# Patient Record
Sex: Male | Born: 1990 | Race: White | Hispanic: No | Marital: Single | Smoking: Current every day smoker
Health system: Southern US, Community
[De-identification: ages and names within clinical notes are randomized; demographics above are authoritative.]

## PROBLEM LIST (undated history)

## (undated) DIAGNOSIS — F909 Attention-deficit hyperactivity disorder, unspecified type: Secondary | ICD-10-CM

## (undated) DIAGNOSIS — I10 Essential (primary) hypertension: Secondary | ICD-10-CM

---
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
## 2009-10-24 ENCOUNTER — Emergency Department (HOSPITAL_COMMUNITY): Admission: EM | Admit: 2009-10-24 | Discharge: 2009-10-24 | Payer: Self-pay | Admitting: Emergency Medicine

## 2010-09-24 ENCOUNTER — Emergency Department (HOSPITAL_COMMUNITY)
Admission: EM | Admit: 2010-09-24 | Discharge: 2010-09-24 | Payer: Self-pay | Source: Home / Self Care | Admitting: Emergency Medicine

## 2010-12-02 ENCOUNTER — Emergency Department: Payer: Self-pay | Admitting: Emergency Medicine

## 2011-05-11 ENCOUNTER — Emergency Department (HOSPITAL_COMMUNITY)
Admission: EM | Admit: 2011-05-11 | Discharge: 2011-05-11 | Disposition: A | Discharge status: Home / Self Care | Payer: Self-pay | Attending: Emergency Medicine | Admitting: Emergency Medicine

## 2011-05-11 DIAGNOSIS — J45909 Unspecified asthma, uncomplicated: Secondary | ICD-10-CM | POA: Insufficient documentation

## 2011-05-11 DIAGNOSIS — K089 Disorder of teeth and supporting structures, unspecified: Secondary | ICD-10-CM | POA: Insufficient documentation

## 2011-05-11 DIAGNOSIS — S025XXA Fracture of tooth (traumatic), initial encounter for closed fracture: Secondary | ICD-10-CM | POA: Insufficient documentation

## 2011-05-11 DIAGNOSIS — X58XXXA Exposure to other specified factors, initial encounter: Secondary | ICD-10-CM | POA: Insufficient documentation

## 2011-05-11 DIAGNOSIS — K029 Dental caries, unspecified: Secondary | ICD-10-CM | POA: Insufficient documentation

## 2011-08-28 ENCOUNTER — Emergency Department (HOSPITAL_COMMUNITY)
Admission: EM | Admit: 2011-08-28 | Discharge: 2011-08-29 | Disposition: A | Discharge status: Home / Self Care | Payer: Self-pay | Attending: Emergency Medicine | Admitting: Emergency Medicine

## 2011-08-28 ENCOUNTER — Encounter: Payer: Self-pay | Admitting: Emergency Medicine

## 2011-08-28 DIAGNOSIS — I880 Nonspecific mesenteric lymphadenitis: Secondary | ICD-10-CM | POA: Insufficient documentation

## 2011-08-28 DIAGNOSIS — J45909 Unspecified asthma, uncomplicated: Secondary | ICD-10-CM | POA: Insufficient documentation

## 2011-08-28 DIAGNOSIS — R109 Unspecified abdominal pain: Secondary | ICD-10-CM | POA: Insufficient documentation

## 2011-08-28 DIAGNOSIS — M549 Dorsalgia, unspecified: Secondary | ICD-10-CM | POA: Insufficient documentation

## 2011-08-28 DIAGNOSIS — R1013 Epigastric pain: Secondary | ICD-10-CM | POA: Insufficient documentation

## 2011-08-28 LAB — CBC
HCT: 44.2 % (ref 39.0–52.0)
MCH: 31.4 pg (ref 26.0–34.0)
MCV: 87.9 fL (ref 78.0–100.0)
Platelets: 241 10*3/uL (ref 150–400)
RBC: 5.03 MIL/uL (ref 4.22–5.81)
RDW: 11.9 % (ref 11.5–15.5)
WBC: 5.1 10*3/uL (ref 4.0–10.5)

## 2011-08-28 LAB — DIFFERENTIAL
Eosinophils Absolute: 0.1 10*3/uL (ref 0.0–0.7)
Eosinophils Relative: 3 % (ref 0–5)
Lymphocytes Relative: 35 % (ref 12–46)
Lymphs Abs: 1.8 10*3/uL (ref 0.7–4.0)
Monocytes Absolute: 0.4 10*3/uL (ref 0.1–1.0)

## 2011-08-28 LAB — COMPREHENSIVE METABOLIC PANEL
AST: 22 U/L (ref 0–37)
Albumin: 4.4 g/dL (ref 3.5–5.2)
Alkaline Phosphatase: 71 U/L (ref 39–117)
BUN: 9 mg/dL (ref 6–23)
Chloride: 103 mEq/L (ref 96–112)
Potassium: 3.7 mEq/L (ref 3.5–5.1)
Sodium: 140 mEq/L (ref 135–145)
Total Bilirubin: 0.2 mg/dL — ABNORMAL LOW (ref 0.3–1.2)
Total Protein: 7.4 g/dL (ref 6.0–8.3)

## 2011-08-28 LAB — URINALYSIS, ROUTINE W REFLEX MICROSCOPIC
Bilirubin Urine: NEGATIVE
Glucose, UA: NEGATIVE mg/dL
Ketones, ur: NEGATIVE mg/dL
Protein, ur: NEGATIVE mg/dL
pH: 7 (ref 5.0–8.0)

## 2011-08-28 MED ORDER — SODIUM CHLORIDE 0.9 % IV SOLN
Freq: Once | INTRAVENOUS | Status: AC
  Administered 2011-08-28: 23:00:00 via INTRAVENOUS

## 2011-08-28 MED ORDER — MORPHINE SULFATE 4 MG/ML IJ SOLN
4.0000 mg | Freq: Once | INTRAMUSCULAR | Status: AC
  Administered 2011-08-28: 4 mg via INTRAVENOUS
  Filled 2011-08-28: qty 1

## 2011-08-28 MED ORDER — ONDANSETRON HCL 4 MG/2ML IJ SOLN
4.0000 mg | Freq: Once | INTRAMUSCULAR | Status: AC
  Administered 2011-08-28: 4 mg via INTRAVENOUS
  Filled 2011-08-28: qty 2

## 2011-08-28 NOTE — ED Provider Notes (Signed)
History     CSN: [id number] Arrival date & time: 08/28/2011  8:31 PM   First MD Initiated Contact with Patient 08/28/11 2217      Chief Complaint  Patient presents with  . Abdominal Pain    (Consider location/radiation/quality/duration/timing/severity/associated sxs/prior treatment) HPI Comments: Shawn Bolton reports 2 days of burning pain in the epigastrium and lower abdomen.  Denies nausea, vomiting, diarrhea.  Pain radiates to his back.  Does not have any dysuria, fever, has never had this pain before.  He, feels more comfortable curled up in a ball when he stands up straight.  Pain increases.  He has taken no over-the-counter medicines except milk of magnesia, although he didn't know what the medication was for reports.  Normal bowel movements.   Patient is a 20 y.o. male presenting with abdominal pain.  Abdominal Pain The primary symptoms of the illness include abdominal pain. The primary symptoms of the illness do not include fever, shortness of breath, nausea, vomiting, diarrhea or dysuria. The current episode started yesterday. The onset of the illness was gradual. The problem has been gradually worsening.  Additional symptoms associated with the illness include back pain. Symptoms associated with the illness do not include chills or constipation.    Past Medical History  Diagnosis Date  . Asthma     History reviewed. No pertinent past surgical history.  No family history on file.  History  Substance Use Topics  . Smoking status: Current Everyday Smoker  . Smokeless tobacco: Not on file  . Alcohol Use: No      Review of Systems  Constitutional: Negative for fever and chills.  HENT: Negative.   Respiratory: Negative for shortness of breath.   Cardiovascular: Negative.   Gastrointestinal: Positive for abdominal pain. Negative for nausea, vomiting, diarrhea, constipation and abdominal distention.  Genitourinary: Negative for dysuria.  Musculoskeletal: Positive for  back pain.  Skin: Negative.   Neurological: Negative.   Hematological: Negative.   Psychiatric/Behavioral: Negative.     Allergies  Review of patient's allergies indicates no known allergies.  Home Medications   Current Outpatient Rx  Name Route Sig Dispense Refill  . MAGNESIUM HYDROXIDE 400 MG/5ML PO SUSP Oral Take 30 mLs by mouth daily as needed. For constipation     . HYDROCODONE-ACETAMINOPHEN 5-325 MG PO TABS Oral Take 1 tablet by mouth every 6 (six) hours as needed for pain. 10 tablet 0    BP 130/67  Pulse 57  Temp(Src) 97.9 F (36.6 C) (Oral)  Resp 16  SpO2 98%  Physical Exam  ED Course  Procedures (including critical care time)  Labs Reviewed  COMPREHENSIVE METABOLIC PANEL - Abnormal; Notable for the following:    Total Bilirubin 0.2 (*)    All other components within normal limits  URINALYSIS, ROUTINE W REFLEX MICROSCOPIC - Abnormal; Notable for the following:    APPearance TURBID (*)    All other components within normal limits  CBC  DIFFERENTIAL  URINE MICROSCOPIC-ADD ON   Ct Abdomen Pelvis W Contrast  08/29/2011  *RADIOLOGY REPORT*  Clinical Data: Right lower quadrant abdominal pain and nausea.  CT ABDOMEN AND PELVIS WITH CONTRAST  Technique:  Multidetector CT imaging of the abdomen and pelvis was performed following the standard protocol during bolus administration of intravenous contrast.  Contrast: OMNIPAQUE IOHEXOL 300 MG/ML IV SOLN  Comparison: None.  Findings: The visualized lung bases are clear.  There is diffuse fatty infiltration within the liver, with mild sparing adjacent to the gallbladder.  The gallbladder  is unremarkable in appearance.  The spleen is normal in appearance. The pancreas and adrenal glands are unremarkable.  The kidneys are unremarkable in appearance.  There is no evidence of hydronephrosis.  No renal or ureteral stones are seen.  No perinephric stranding is appreciated.  No free fluid is identified.  The small bowel is  unremarkable in appearance.  The stomach is within normal limits.  No acute vascular abnormalities are seen.  A few mildly prominent pericecal nodes are seen.  The appendix is normal in caliber, without evidence for appendicitis.  The colon is unremarkable in appearance.  The bladder is mildly distended and grossly unremarkable.  The prostate remains normal in size.  No inguinal lymphadenopathy is seen.  No acute osseous abnormalities are identified.  IMPRESSION:  1.  No evidence for appendicitis. 2.  Few mildly prominent pericecal nodes could conceivably reflect minimal mesenteric adenitis. 3.  Diffuse fatty infiltration within the liver.  Original Report Authenticated By: Tonia Ghent, M.D.     1. Acute mesenteric adenitis     At 1:48 AM patient, reports he's gotten no relief from IV morphine for his pain still reporting a 5/10.  We'll switch to 0.5 mg Dilaudid and reevaluate  MDM  After reviewing labs will evaluate for acute appendicitis with CT scan        Arman Filter, NP 08/29/11 0149  Arman Filter, NP 08/29/11 0423  Arman Filter, NP 08/29/11 [phone]

## 2011-08-28 NOTE — ED Notes (Signed)
PT. REPORTS MID ABDOMINAL PAIN FOR 2 DAYS WITH SLIGHT NAUSEA , DENIES VOMITTING OR DIARRHEA.  NO FEVER OR CHILLS.

## 2011-08-29 ENCOUNTER — Emergency Department (HOSPITAL_COMMUNITY): Payer: Self-pay

## 2011-08-29 MED ORDER — HYDROMORPHONE HCL PF 1 MG/ML IJ SOLN
INTRAMUSCULAR | Status: AC
  Filled 2011-08-29: qty 1

## 2011-08-29 MED ORDER — HYDROMORPHONE HCL PF 1 MG/ML IJ SOLN
0.5000 mg | Freq: Once | INTRAMUSCULAR | Status: AC
  Administered 2011-08-29: 0.5 mg via INTRAVENOUS

## 2011-08-29 MED ORDER — HYDROCODONE-ACETAMINOPHEN 5-325 MG PO TABS: 1.0000 | ORAL_TABLET | Freq: Four times a day (QID) | ORAL | Status: AC | PRN

## 2011-08-29 MED ORDER — IOHEXOL 300 MG/ML  SOLN
100.0000 mL | Freq: Once | INTRAMUSCULAR | Status: AC | PRN
  Administered 2011-08-29: 100 mL via INTRAVENOUS

## 2011-08-29 MED ORDER — MORPHINE SULFATE 4 MG/ML IJ SOLN
4.0000 mg | INTRAMUSCULAR | Status: DC | PRN
  Administered 2011-08-29: 4 mg via INTRAVENOUS
  Filled 2011-08-29: qty 1

## 2011-08-29 MED ORDER — HYDROMORPHONE HCL PF 1 MG/ML IJ SOLN
0.5000 mg | Freq: Once | INTRAMUSCULAR | Status: AC
  Administered 2011-08-29: 0.5 mg via INTRAVENOUS
  Filled 2011-08-29: qty 1

## 2011-08-29 NOTE — ED Notes (Signed)
Pt voiced understanding to f/u with PCP and take rx as prescribed.

## 2011-08-30 NOTE — ED Provider Notes (Signed)
Medical screening examination/treatment/procedure(s) were performed by non-physician practitioner and as supervising physician I was immediately available for consultation/collaboration.   Benny Lennert, MD 08/30/11 [phone]

## 2012-07-26 ENCOUNTER — Encounter (HOSPITAL_COMMUNITY): Payer: Self-pay | Admitting: *Deleted

## 2012-07-26 ENCOUNTER — Emergency Department (HOSPITAL_COMMUNITY)
Admission: EM | Admit: 2012-07-26 | Discharge: 2012-07-26 | Disposition: A | Discharge status: Home / Self Care | Payer: Self-pay | Attending: Emergency Medicine | Admitting: Emergency Medicine

## 2012-07-26 DIAGNOSIS — J45909 Unspecified asthma, uncomplicated: Secondary | ICD-10-CM | POA: Insufficient documentation

## 2012-07-26 DIAGNOSIS — F17200 Nicotine dependence, unspecified, uncomplicated: Secondary | ICD-10-CM | POA: Insufficient documentation

## 2012-07-26 DIAGNOSIS — M436 Torticollis: Secondary | ICD-10-CM | POA: Insufficient documentation

## 2012-07-26 MED ORDER — IBUPROFEN 800 MG PO TABS: 800.0000 mg | ORAL_TABLET | Freq: Three times a day (TID) | ORAL | Status: DC

## 2012-07-26 MED ORDER — CYCLOBENZAPRINE HCL 10 MG PO TABS: 10.0000 mg | ORAL_TABLET | Freq: Two times a day (BID) | ORAL | Status: DC | PRN

## 2012-07-26 MED ORDER — OXYCODONE-ACETAMINOPHEN 5-325 MG PO TABS: 1.0000 | ORAL_TABLET | Freq: Four times a day (QID) | ORAL | Status: DC | PRN

## 2012-07-26 NOTE — ED Provider Notes (Signed)
History     CSN: [id number]  Arrival date & time 07/26/12  0740   First MD Initiated Contact with Patient 07/26/12 [phone]      Chief Complaint  Patient presents with  . Neck Pain    (Consider location/radiation/quality/duration/timing/severity/associated sxs/prior treatment) HPI Comments: Shawn Bolton present ambulatory for evaluation of right-sided neck pain.  He denies fever or trauma but states that his neck has been stiff for 2 weeks.  He is unsure if it is associated with the heavy lifting he performs at work.  He states the pain is worsened by rotation of the neck or flexion.  He denies any other complaints.  Patient is a 21 y.o. male presenting with neck pain. The history is provided by the patient. No language interpreter was used.  Neck Pain  This is a new problem. The current episode started more than 1 week ago (2 weeks). The problem has been gradually worsening. The pain is associated with nothing. There has been no fever. The pain is present in the right side. The quality of the pain is described as cramping. The pain does not radiate. The pain is at a severity of 8/10. The pain is severe. The symptoms are aggravated by twisting (flexion of neck and turning to the right). The pain is the same all the time. Stiffness is present in the morning. Pertinent negatives include no photophobia, no visual change, no chest pain, no numbness, no headaches, no leg pain, no paresis, no tingling and no weakness. He has tried NSAIDs for the symptoms. The treatment provided mild relief.    Past Medical History  Diagnosis Date  . Asthma     History reviewed. No pertinent past surgical history.  No family history on file.  History  Substance Use Topics  . Smoking status: Current Every Day Smoker  . Smokeless tobacco: Not on file  . Alcohol Use: No      Review of Systems  HENT: Positive for neck pain.   Eyes: Negative for photophobia.  Cardiovascular: Negative for chest pain.    Neurological: Negative for tingling, weakness, numbness and headaches.  All other systems reviewed and are negative.    Allergies  Review of patient's allergies indicates no known allergies.  Home Medications   Current Outpatient Rx  Name  Route  Sig  Dispense  Refill  . IBUPROFEN 200 MG PO TABS   Oral   Take 400 mg by mouth every 6 (six) hours as needed. For neck pain           BP 148/82  Pulse 71  Temp 97.8 F (36.6 C) (Oral)  Resp 16  SpO2 98%  Physical Exam  Nursing note and vitals reviewed. Constitutional: He is oriented to person, place, and time. He appears well-developed and well-nourished. He is active.  Non-toxic appearance. He does not have a sickly appearance. He does not appear ill. No distress.  HENT:  Head: Normocephalic and atraumatic.  Right Ear: External ear normal.  Left Ear: External ear normal.  Nose: Nose normal.  Mouth/Throat: Oropharynx is clear and moist. No oropharyngeal exudate.  Eyes: Conjunctivae normal are normal. Pupils are equal, round, and reactive to light. Right eye exhibits no discharge. Left eye exhibits no discharge. No scleral icterus.  Neck: Trachea normal and phonation normal. Neck supple. Normal carotid pulses and no JVD present. Muscular tenderness present. No tracheal tenderness and no spinous process tenderness present. Carotid bruit is not present. No rigidity. Decreased range of motion present. No  tracheal deviation, no edema and no erythema present.    Cardiovascular: Normal rate, regular rhythm, normal heart sounds and intact distal pulses.  Exam reveals no gallop and no friction rub.   No murmur heard. Pulmonary/Chest: Effort normal and breath sounds normal. No stridor. No respiratory distress. He has no wheezes. He has no rales. He exhibits no tenderness.  Abdominal: Soft. Bowel sounds are normal. He exhibits no distension. There is no tenderness. There is no guarding.  Musculoskeletal: He exhibits tenderness (see neck  exam). He exhibits no edema.  Lymphadenopathy:    He has no cervical adenopathy.  Neurological: He is alert and oriented to person, place, and time. No cranial nerve deficit.  Skin: Skin is warm and dry. No rash noted. He is not diaphoretic. No erythema. No pallor.  Psychiatric: He has a normal mood and affect. His behavior is normal.    ED Course  Procedures (including critical care time)  Labs Reviewed - No data to display No results found.   No diagnosis found.    MDM  Pt presents for evaluation of neck pain.  He appears nontoxic and has no clinical evidence of meningitis.  His exam is consistent with cervical muscle spasm/torticollis.  Will treat with rest, ibuprofen, flexeril, and prn percocet (the percocet will be limited to a 48 hour course).  Plan discharge home.        Tobin Chad, MD 07/26/12 [phone]

## 2012-07-26 NOTE — ED Notes (Signed)
Pt reports stiff neck/pain x 2 weeks. No known injury, however states does a lot of heavy lifting at work. Taken OTC ibuprofen without relief. Denies fever/chills/headache.

## 2013-02-05 ENCOUNTER — Encounter (HOSPITAL_COMMUNITY): Payer: Self-pay | Admitting: *Deleted

## 2013-02-05 ENCOUNTER — Emergency Department (HOSPITAL_COMMUNITY)
Admission: EM | Admit: 2013-02-05 | Discharge: 2013-02-05 | Disposition: A | Discharge status: Home / Self Care | Payer: Self-pay | Attending: Emergency Medicine | Admitting: Emergency Medicine

## 2013-02-05 DIAGNOSIS — Y939 Activity, unspecified: Secondary | ICD-10-CM | POA: Insufficient documentation

## 2013-02-05 DIAGNOSIS — F17200 Nicotine dependence, unspecified, uncomplicated: Secondary | ICD-10-CM | POA: Insufficient documentation

## 2013-02-05 DIAGNOSIS — S1096XA Insect bite of unspecified part of neck, initial encounter: Secondary | ICD-10-CM | POA: Insufficient documentation

## 2013-02-05 DIAGNOSIS — W57XXXA Bitten or stung by nonvenomous insect and other nonvenomous arthropods, initial encounter: Secondary | ICD-10-CM | POA: Insufficient documentation

## 2013-02-05 DIAGNOSIS — Y929 Unspecified place or not applicable: Secondary | ICD-10-CM | POA: Insufficient documentation

## 2013-02-05 DIAGNOSIS — J45909 Unspecified asthma, uncomplicated: Secondary | ICD-10-CM | POA: Insufficient documentation

## 2013-02-05 MED ORDER — DOXYCYCLINE HYCLATE 100 MG PO TABS
100.0000 mg | ORAL_TABLET | Freq: Once | ORAL | Status: AC
  Administered 2013-02-05: 100 mg via ORAL
  Filled 2013-02-05: qty 1

## 2013-02-05 MED ORDER — ACETAMINOPHEN 325 MG PO TABS
650.0000 mg | ORAL_TABLET | Freq: Once | ORAL | Status: AC
  Administered 2013-02-05: 650 mg via ORAL
  Filled 2013-02-05: qty 2

## 2013-02-05 MED ORDER — DOXYCYCLINE HYCLATE 100 MG PO CAPS: 100.0000 mg | ORAL_CAPSULE | Freq: Two times a day (BID) | ORAL | Status: DC

## 2013-02-05 MED ORDER — ONDANSETRON 4 MG PO TBDP
4.0000 mg | ORAL_TABLET | Freq: Once | ORAL | Status: AC
  Administered 2013-02-05: 4 mg via ORAL
  Filled 2013-02-05: qty 1

## 2013-02-05 MED ORDER — PROMETHAZINE HCL 25 MG PO TABS
25.0000 mg | ORAL_TABLET | Freq: Four times a day (QID) | ORAL | Status: DC | PRN
Start: 2013-02-05 — End: 2013-09-25

## 2013-02-05 NOTE — ED Notes (Signed)
Pt states he was bit by a tick yesterday and has not felt well since he woke up this AM. Red area noted to upper thigh less than 1 cm in diameter, surrounding skin intact. No drainage noted to bite. Pt unsure of what type of tic it was. Pt has had fevers at home that he took ibuprophen for.

## 2013-02-05 NOTE — ED Provider Notes (Signed)
History     CSN: [id number]  Arrival date & time 02/05/13  6578   First MD Initiated Contact with Patient 02/05/13 1821      Chief Complaint  Patient presents with  . Headache  . Emesis    (Consider location/radiation/quality/duration/timing/severity/associated sxs/prior treatment) HPI  She presents the emergency department for evaluation after obtaining a tick bite yesterday. Said the tick was not engorged and was tiny. He took procedures and was able to remove the whole take including head. Today he has a mild headache which relieved with ibuprofen and also some nausea. He said he had one episode of spitting up some clear fluid. He also like for [place] to evaluate his left from. He has chronic left thumb pain after injury to have required surgery and continues to have pain. His hand surgeon, he needs to continue to use it and the pain will get better. His vital signs are stable the patient is awake and alert and says the pain is mild. He is otherwise healthy and has no past medical history aside from asthma.  Past Medical History  Diagnosis Date  . Asthma     History reviewed. No pertinent past surgical history.  History reviewed. No pertinent family history.  History  Substance Use Topics  . Smoking status: Current Every Day Smoker  . Smokeless tobacco: Not on file  . Alcohol Use: No      Review of Systems  All other systems reviewed and are negative.    Allergies  Aspirin  Home Medications   Current Outpatient Rx  Name  Route  Sig  Dispense  Refill  . ibuprofen (ADVIL,MOTRIN) 200 MG tablet   Oral   Take 400 mg by mouth every 6 (six) hours as needed for pain or fever.          . loratadine (CLARITIN) 10 MG tablet   Oral   Take 10 mg by mouth daily as needed for allergies.         Marland Kitchen traMADol (ULTRAM) 50 MG tablet   Oral   Take 50 mg by mouth every 6 (six) hours as needed for pain.           BP 134/66  Pulse 75  Temp(Src) 97.8 F (36.6 C) (Oral)   Resp 18  SpO2 98%  Physical Exam  Nursing note and vitals reviewed. Constitutional: He appears well-developed and well-nourished. No distress.  HENT:  Head: Normocephalic and atraumatic.  Mouth/Throat: Uvula is midline and oropharynx is clear and moist.  Eyes: Pupils are equal, round, and reactive to light.  Neck: Trachea normal, normal range of motion and full passive range of motion without pain. Neck supple. No spinous process tenderness and no muscular tenderness present. No rigidity. Normal range of motion present. No Brudzinski's sign and no Kernig's sign noted.  Cardiovascular: Normal rate and regular rhythm.   Pulmonary/Chest: Effort normal.  Abdominal: Soft. There is no tenderness.  No abdominal pain or tenderness  Musculoskeletal:       Legs: Neurological: He is alert.  Skin: Skin is warm and dry.  No rash to body    ED Course  Procedures (including critical care time)  Labs Reviewed  LYME DISEASE DNA BY PCR(BORRELIA BURG)  ROCKY MTN SPOTTED FVR AB, IGG-BLOOD  ROCKY MTN SPOTTED FVR AB, IGM-BLOOD   No results found.   1. Tick bite       MDM  . The patient does not display stereotypical symptoms for tick born illness no  neck soreness.   Will run titers for lyme disease and RMS fever. Will place on doxy 100mg  Q12 hours for 2 weeks. Case discussed with Dr. Manus Gunning who agrees with treatment and plan.  Pt has been advised of the symptoms that warrant their return to the ED. Patient has voiced understanding and has agreed to follow-up with the PCP or specialist.        Dorthula Matas, PA-C 02/05/13 1856

## 2013-02-05 NOTE — ED Notes (Signed)
Pt reports getting bit by a tick yesterday and now having headache, body pain and n/v. Also has chronic pain to left thumb that he wants evaluated.

## 2013-02-06 NOTE — ED Provider Notes (Signed)
Medical screening examination/treatment/procedure(s) were performed by non-physician practitioner and as supervising physician I was immediately available for consultation/collaboration.   Shyloh Krinke, MD 02/06/13 0157 

## 2013-02-07 LAB — ROCKY MTN SPOTTED FVR AB, IGM-BLOOD: RMSF IgM: 0.17 IV (ref 0.00–0.89)

## 2013-02-07 LAB — ROCKY MTN SPOTTED FVR AB, IGG-BLOOD: RMSF IgG: 0.1 IV

## 2013-02-08 LAB — B. BURGDORFI ANTIBODIES: B burgdorferi Ab IgG+IgM: 0.17 {ISR}

## 2013-09-25 ENCOUNTER — Emergency Department (HOSPITAL_COMMUNITY)
Admission: EM | Admit: 2013-09-25 | Discharge: 2013-09-25 | Disposition: A | Discharge status: Home / Self Care | Payer: Medicaid Other | Attending: Emergency Medicine | Admitting: Emergency Medicine

## 2013-09-25 ENCOUNTER — Encounter (HOSPITAL_COMMUNITY): Payer: Self-pay | Admitting: Emergency Medicine

## 2013-09-25 DIAGNOSIS — F1123 Opioid dependence with withdrawal: Secondary | ICD-10-CM

## 2013-09-25 DIAGNOSIS — F1110 Opioid abuse, uncomplicated: Secondary | ICD-10-CM | POA: Insufficient documentation

## 2013-09-25 DIAGNOSIS — R259 Unspecified abnormal involuntary movements: Secondary | ICD-10-CM | POA: Insufficient documentation

## 2013-09-25 DIAGNOSIS — F17200 Nicotine dependence, unspecified, uncomplicated: Secondary | ICD-10-CM | POA: Insufficient documentation

## 2013-09-25 DIAGNOSIS — F19939 Other psychoactive substance use, unspecified with withdrawal, unspecified: Secondary | ICD-10-CM | POA: Insufficient documentation

## 2013-09-25 DIAGNOSIS — R079 Chest pain, unspecified: Secondary | ICD-10-CM | POA: Insufficient documentation

## 2013-09-25 DIAGNOSIS — R630 Anorexia: Secondary | ICD-10-CM | POA: Insufficient documentation

## 2013-09-25 DIAGNOSIS — R197 Diarrhea, unspecified: Secondary | ICD-10-CM | POA: Insufficient documentation

## 2013-09-25 DIAGNOSIS — F1193 Opioid use, unspecified with withdrawal: Secondary | ICD-10-CM

## 2013-09-25 DIAGNOSIS — Z888 Allergy status to other drugs, medicaments and biological substances status: Secondary | ICD-10-CM | POA: Insufficient documentation

## 2013-09-25 DIAGNOSIS — Z79899 Other long term (current) drug therapy: Secondary | ICD-10-CM | POA: Insufficient documentation

## 2013-09-25 DIAGNOSIS — R0789 Other chest pain: Secondary | ICD-10-CM | POA: Insufficient documentation

## 2013-09-25 DIAGNOSIS — F1210 Cannabis abuse, uncomplicated: Secondary | ICD-10-CM | POA: Insufficient documentation

## 2013-09-25 DIAGNOSIS — IMO0001 Reserved for inherently not codable concepts without codable children: Secondary | ICD-10-CM | POA: Insufficient documentation

## 2013-09-25 DIAGNOSIS — J45909 Unspecified asthma, uncomplicated: Secondary | ICD-10-CM | POA: Insufficient documentation

## 2013-09-25 LAB — RAPID URINE DRUG SCREEN, HOSP PERFORMED
Amphetamines: NOT DETECTED
BARBITURATES: NOT DETECTED
BENZODIAZEPINES: NOT DETECTED
Cocaine: NOT DETECTED
Opiates: NOT DETECTED
TETRAHYDROCANNABINOL: POSITIVE — AB

## 2013-09-25 LAB — CBC
HCT: 45.7 % (ref 39.0–52.0)
Hemoglobin: 16.8 g/dL (ref 13.0–17.0)
MCH: 32.7 pg (ref 26.0–34.0)
MCHC: 36.8 g/dL — ABNORMAL HIGH (ref 30.0–36.0)
MCV: 89.1 fL (ref 78.0–100.0)
PLATELETS: 231 10*3/uL (ref 150–400)
RBC: 5.13 MIL/uL (ref 4.22–5.81)
RDW: 12.3 % (ref 11.5–15.5)
WBC: 10.4 10*3/uL (ref 4.0–10.5)

## 2013-09-25 LAB — SALICYLATE LEVEL: Salicylate Lvl: <2 mg/dL — ABNORMAL LOW (ref 2.8–20.0)

## 2013-09-25 LAB — COMPREHENSIVE METABOLIC PANEL
ALBUMIN: 4.9 g/dL (ref 3.5–5.2)
ALK PHOS: 94 U/L (ref 39–117)
ALT: 11 U/L (ref 0–53)
AST: 18 U/L (ref 0–37)
BUN: 5 mg/dL — ABNORMAL LOW (ref 6–23)
CO2: 24 mEq/L (ref 19–32)
CREATININE: 0.62 mg/dL (ref 0.50–1.35)
Calcium: 9.8 mg/dL (ref 8.4–10.5)
Chloride: 105 mEq/L (ref 96–112)
GFR calc Af Amer: >90 mL/min (ref >90)
Glucose, Bld: 92 mg/dL (ref 70–99)
POTASSIUM: 4.3 meq/L (ref 3.7–5.3)
Sodium: 144 mEq/L (ref 137–147)
Total Bilirubin: 0.9 mg/dL (ref 0.3–1.2)
Total Protein: 8.1 g/dL (ref 6.0–8.3)

## 2013-09-25 LAB — ACETAMINOPHEN LEVEL: Acetaminophen (Tylenol), Serum: <15 ug/mL (ref 10–30)

## 2013-09-25 LAB — ETHANOL: Alcohol, Ethyl (B): <11 mg/dL (ref 0–11)

## 2013-09-25 MED ORDER — CLONIDINE HCL 0.1 MG PO TABS: 0.1000 mg | ORAL_TABLET | Freq: Two times a day (BID) | ORAL | Status: AC

## 2013-09-25 MED ORDER — SODIUM CHLORIDE 0.9 % IV BOLUS (SEPSIS)
1000.0000 mL | Freq: Once | INTRAVENOUS | Status: AC
  Administered 2013-09-25: 1000 mL via INTRAVENOUS

## 2013-09-25 MED ORDER — ONDANSETRON HCL 4 MG PO TABS: 4.0000 mg | ORAL_TABLET | Freq: Four times a day (QID) | ORAL | Status: AC

## 2013-09-25 MED ORDER — ONDANSETRON HCL 4 MG/2ML IJ SOLN
4.0000 mg | Freq: Once | INTRAMUSCULAR | Status: AC
  Administered 2013-09-25: 4 mg via INTRAVENOUS
  Filled 2013-09-25: qty 2

## 2013-09-25 MED ORDER — LORAZEPAM 2 MG/ML IJ SOLN
1.0000 mg | Freq: Once | INTRAMUSCULAR | Status: AC
  Administered 2013-09-25: 1 mg via INTRAVENOUS
  Filled 2013-09-25: qty 1

## 2013-09-25 NOTE — Discharge Instructions (Signed)
Follow up with your doctor or return her for continued symptoms Narcotic Withdrawal When drug use interferes with normal living activities and relationships, it is abuse. Abuse includes problems with family and friends. Psychological dependence has developed when your mind tells you that the drug is needed. This is usually followed by a physical dependence in which you need more of the drug to get the same feeling or "high." This is known as addiction or chemical dependency. Risk is greater when chemical dependency exists in the family. SYMPTOMS  When tolerance to narcotics has developed, stopping of the narcotic suddenly can cause uncomfortable physical symptoms. Most of the time these are mild and consist of shakes or jitters (tremors) in the hands,a rapid heart rate, rapid breathing, and temperature. Sometimes these symptoms are associated with anxiety, panic attacks, and bad dreams. Other symptoms include:  Irritability.  Anxiety.  Runny nose.  "Goose flesh."  Diarrhea.  Feeling sick to the stomach (nauseous).  Muscle spasms.  Sleeplessness.  Chills.  Sweats.  Drug cravings.  Confusion. The severity of the withdrawal is based on the individual and varies from person to person. Many people choose to continue using narcotics to get rid of the discomfort of withdrawal. They also use to try to feel normal. TREATMENT  Quitting an addiction means stopping use of all chemicals. This is hard but may save your life. With continual drug use, possible outcomes are often loss of self respect and esteem, violence, death, and eventually prison if the use of narcotics has led to the death of another. Addiction cannot be cured, but it can be stopped. This often requires outside help and the care of professionals. Most hospitals and clinics can refer you to a specialized care center. It is not necessary for you to go through the uncomfortable symptoms of withdrawal. Your caregiver can provide you  with medications that will help you through this difficult period. Try to avoid situations, friends, or alcohol, which may have made it possible for you to continue using narcotics in the past. Learn how to say no! HOME CARE INSTRUCTIONS   Drink fluids, get plenty of rest, and take hot baths.  Medicines may be prescribed to help control withdrawal symptoms.  Over-the-counter medicines may be helpful to control diarrhea or an upset stomach.  If your problems resulted from taking prescription pain medicines, make sure you have a follow-up visit with your caregiver within the next few days. Be open about this problem.  If you are dependent or addicted to street drugs, contact a local drug and alcohol treatment center or Narcotics Anonymous.  Have someone with you to monitor your symptoms.  Engage in healthy activities with friends who do not use drugs.  Stay away from the drug scene. It takes a long period of time to overcome addictions to all drugs. There may be times when you feel as though you want to use. Following loss of a physical addiction and going through withdrawal, you have conquered the most difficult part of getting rid of an addiction. Gradually, you will have a lessening of the craving that is telling you that you need narcotics to feel normal. Call your caregiver or a member of your support group if more support is needed. Learn who to talk to in your family and among your friends so that during these periods you can receive outside help. SEEK IMMEDIATE MEDICAL CARE IF:   You have vomiting that cannot be controlled, especially if you cannot keep liquids down.  You are seeing things or hearing voices that are not really there (hallucinating).  You have a seizure. Document Released: 11/29/2002 Document Revised: 12/01/2011 Document Reviewed: 09/03/2008 [facility] Patient Information 2014 [place], [place].

## 2013-09-25 NOTE — ED Provider Notes (Signed)
CSN: [id number]     Arrival date & time 09/25/13  1340 History   First MD Initiated Contact with Patient 09/25/13 1511     Chief Complaint  Patient presents with  . Medical Clearance   (Consider location/radiation/quality/duration/timing/severity/associated sxs/prior Treatment) HPI Comments: Pt states that he was tapered off of methadone by the clinic and he hasn't had anything in the last 4 to 5 days:pt state that he has been a little shaky today and has diarrhea and decreased appetitive the last couple days:no vomiting:has had intermittent chest pain and body aches today:denies hallucinations:pt states that he has a history of heroin abuse  The history is provided by the patient. No language interpreter was used.    Past Medical History  Diagnosis Date  . Asthma    History reviewed. No pertinent past surgical history. History reviewed. No pertinent family history. History  Substance Use Topics  . Smoking status: Current Every Day Smoker    Types: Cigarettes  . Smokeless tobacco: Not on file  . Alcohol Use: No    Review of Systems  Constitutional: Negative.   Respiratory: Positive for chest tightness. Negative for shortness of breath.   Cardiovascular: Positive for chest pain.    Allergies  Aspirin  Home Medications   Current Outpatient Rx  Name  Route  Sig  Dispense  Refill  . doxycycline (VIBRAMYCIN) 100 MG capsule   Oral   Take 1 capsule (100 mg total) by mouth 2 (two) times daily.   28 capsule   0   . ibuprofen (ADVIL,MOTRIN) 200 MG tablet   Oral   Take 400 mg by mouth every 6 (six) hours as needed for pain or fever.          . loratadine (CLARITIN) 10 MG tablet   Oral   Take 10 mg by mouth daily as needed for allergies.         . promethazine (PHENERGAN) 25 MG tablet   Oral   Take 1 tablet (25 mg total) by mouth every 6 (six) hours as needed for nausea.   30 tablet   0   . traMADol (ULTRAM) 50 MG tablet   Oral   Take 50 mg by mouth every 6 (six)  hours as needed for pain.          BP 138/90  Pulse 83  Temp(Src) 98.9 F (37.2 C) (Oral)  Resp 20  SpO2 99% Physical Exam  Nursing note and vitals reviewed. Constitutional: He is oriented to person, place, and time. He appears well-developed and well-nourished.  HENT:  Head: Normocephalic and atraumatic.  Right Ear: External ear normal.  Left Ear: External ear normal.  Cardiovascular: Normal rate and regular rhythm.   Pulmonary/Chest: Effort normal and breath sounds normal.  Musculoskeletal: Normal range of motion.  Neurological: He is alert and oriented to person, place, and time.  Skin: Skin is warm and dry.  Psychiatric:  Pt tremulous on exam    ED Course  Procedures (including critical care time) Labs Review Labs Reviewed  CBC - Abnormal; Notable for the following:    MCHC 36.8 (*)    All other components within normal limits  COMPREHENSIVE METABOLIC PANEL - Abnormal; Notable for the following:    BUN 5 (*)    All other components within normal limits  SALICYLATE LEVEL - Abnormal; Notable for the following:    Salicylate Lvl <2.0 (*)    All other components within normal limits  URINE RAPID DRUG SCREEN (HOSP PERFORMED) -  Abnormal; Notable for the following:    Tetrahydrocannabinol POSITIVE (*)    All other components within normal limits  ACETAMINOPHEN LEVEL  ETHANOL   Imaging Review No results found.  EKG Interpretation    Date/Time:  Sunday September 25 2013 13:42:39 EST Ventricular Rate:  81 PR Interval:  126 QRS Duration: 98 QT Interval:  350 QTC Calculation: 406 R Axis:   88 Text Interpretation:  Normal sinus rhythm with sinus arrhythmia Normal ECG No previous tracing Confirmed by Anitra LauthPLUNKETT  MD, WHITNEY ([id number]) on 09/25/2013 3:24:28 PM            MDM   1. Marijuana abuse   2. Methadone withdrawal    Pt is feeling better at this time:will send home on something for nausea:pt vitals are stable    Teressa LowerVrinda Kashish Yglesias, NP 09/25/13 1735

## 2013-09-25 NOTE — ED Notes (Addendum)
Pt states he was tapered off his methadone 5 days ago because he couldn't afford to go the clinic anymore. Today he began to have tightness in chest and felt like his heart was racing so the methadone clinic told him to come to ed for treatment. Pt states his legs and back hurt too. Pt denies other substance use. Denies SI or HI.

## 2013-09-26 NOTE — ED Provider Notes (Signed)
Medical screening examination/treatment/procedure(s) were performed by non-physician practitioner and as supervising physician I was immediately available for consultation/collaboration.  EKG Interpretation    Date/Time:  Sunday September 25 2013 13:42:39 EST Ventricular Rate:  81 PR Interval:  126 QRS Duration: 98 QT Interval:  350 QTC Calculation: 406 R Axis:   88 Text Interpretation:  Normal sinus rhythm with sinus arrhythmia Normal ECG No previous tracing Confirmed by Anitra LauthPLUNKETT  MD, Elfego Giammarino ([id number]) on 09/25/2013 3:24:28 PM              Gwyneth SproutWhitney Vergie Zahm, MD 09/26/13 1539

## 2014-12-09 ENCOUNTER — Emergency Department: Payer: Self-pay | Admitting: Emergency Medicine

## 2015-03-12 ENCOUNTER — Emergency Department (HOSPITAL_COMMUNITY)
Admission: EM | Admit: 2015-03-12 | Discharge: 2015-03-12 | Disposition: A | Discharge status: Home / Self Care | Payer: Medicaid Other | Attending: Emergency Medicine | Admitting: Emergency Medicine

## 2015-03-12 ENCOUNTER — Encounter (HOSPITAL_COMMUNITY): Payer: Self-pay | Admitting: Family Medicine

## 2015-03-12 DIAGNOSIS — W57XXXA Bitten or stung by nonvenomous insect and other nonvenomous arthropods, initial encounter: Secondary | ICD-10-CM | POA: Diagnosis not present

## 2015-03-12 DIAGNOSIS — S30861A Insect bite (nonvenomous) of abdominal wall, initial encounter: Secondary | ICD-10-CM | POA: Diagnosis not present

## 2015-03-12 DIAGNOSIS — M545 Low back pain: Secondary | ICD-10-CM | POA: Insufficient documentation

## 2015-03-12 DIAGNOSIS — Y9289 Other specified places as the place of occurrence of the external cause: Secondary | ICD-10-CM | POA: Insufficient documentation

## 2015-03-12 DIAGNOSIS — K088 Other specified disorders of teeth and supporting structures: Secondary | ICD-10-CM | POA: Diagnosis present

## 2015-03-12 DIAGNOSIS — Y9389 Activity, other specified: Secondary | ICD-10-CM | POA: Diagnosis not present

## 2015-03-12 DIAGNOSIS — J45909 Unspecified asthma, uncomplicated: Secondary | ICD-10-CM | POA: Insufficient documentation

## 2015-03-12 DIAGNOSIS — Z720 Tobacco use: Secondary | ICD-10-CM | POA: Diagnosis not present

## 2015-03-12 DIAGNOSIS — K029 Dental caries, unspecified: Secondary | ICD-10-CM | POA: Diagnosis not present

## 2015-03-12 DIAGNOSIS — Y998 Other external cause status: Secondary | ICD-10-CM | POA: Diagnosis not present

## 2015-03-12 MED ORDER — AMOXICILLIN 500 MG PO CAPS: 500.0000 mg | ORAL_CAPSULE | Freq: Three times a day (TID) | ORAL | Status: DC

## 2015-03-12 MED ORDER — NAPROXEN 500 MG PO TABS: 500.0000 mg | ORAL_TABLET | Freq: Two times a day (BID) | ORAL | Status: DC

## 2015-03-12 NOTE — ED Notes (Signed)
Declined W/C at D/C and was escorted to lobby by RN. 

## 2015-03-12 NOTE — ED Notes (Signed)
Pt here for bu bite to abdomen x 2 weeks and some dental pain.

## 2015-03-12 NOTE — Discharge Instructions (Signed)
Follow up with your dentist as soon as possible.

## 2015-03-12 NOTE — ED Provider Notes (Signed)
CSN: [id number]     Arrival date & time 03/12/15  1744 History   This chart was scribed for non-physician practitioner, [facility] M. Damian Leavell, NP working with Lorre Nick, MD by Doreatha Martin, ED scribe. This patient was seen in room TR07C/TR07C and the patient's care was started at 6:19 PM     Chief Complaint  Patient presents with  . Insect Bite  . Dental Pain   Patient is a 24 y.o. male presenting with tooth pain. The history is provided by the patient. No language interpreter was used.  Dental Pain Location:  Lower Lower teeth location:  18/LL 2nd molar Quality:  Constant Severity:  Moderate Onset quality:  Gradual Timing:  Constant Progression:  Unchanged Chronicity:  New Relieved by:  Acetaminophen Worsened by:  Nothing tried Ineffective treatments:  None tried   HPI Comments: Shawn Bolton is a 24 y.o. male who presents to the Emergency Department complaining of a raised, erythematous insect bite to the mid-upper abdomen onset 1 week ago. He states associated numbness in the area, upper back pain with inspiration and lower dental pain of the 2nd molar. He states that he did not see what bit him. Pt reports that the bite started out small and got bigger. He reports that he was not able to get into his dentist for over a month and was advised to come to the ED for pain relief if it was unbearable. He states he has tried OTC hydrocortisone cream and tea tree oil with mild relief. He has taken Ibuprofen with mild relief of the dental pain. Pt is not allergic to Penicillin. He denies otalgia.   Past Medical History  Diagnosis Date  . Asthma    History reviewed. No pertinent past surgical history. History reviewed. No pertinent family history. History  Substance Use Topics  . Smoking status: Current Every Day Smoker    Types: Cigarettes  . Smokeless tobacco: Not on file  . Alcohol Use: No    Review of Systems  HENT: Positive for dental problem. Negative for ear pain.    Musculoskeletal: Positive for back pain.  Skin: Positive for wound.  Neurological: Positive for numbness.  All other systems reviewed and are negative.  Allergies  Aspirin  Home Medications   Prior to Admission medications   Medication Sig Start Date End Date Taking? Authorizing Provider  ibuprofen (ADVIL,MOTRIN) 200 MG tablet Take 200 mg by mouth every 6 (six) hours as needed for moderate pain.   Yes Historical Provider, MD  amoxicillin (AMOXIL) 500 MG capsule Take 1 capsule (500 mg total) by mouth 3 (three) times daily. 03/12/15   Kimberle Stanfill Orlene Och, NP  cloNIDine (CATAPRES) 0.1 MG tablet Take 1 tablet (0.1 mg total) by mouth 2 (two) times daily. Patient not taking: Reported on 03/12/2015 09/25/13   Teressa Lower, NP  naproxen (NAPROSYN) 500 MG tablet Take 1 tablet (500 mg total) by mouth 2 (two) times daily. 03/12/15   Enzley Kitchens Orlene Och, NP  ondansetron (ZOFRAN) 4 MG tablet Take 1 tablet (4 mg total) by mouth every 6 (six) hours. Patient not taking: Reported on 03/12/2015 09/25/13   Teressa Lower, NP   BP 130/71 mmHg  Pulse 77  Temp(Src) 98.1 F (36.7 C) (Oral)  Resp 18  Ht 5\' 11"  (1.803 m)  Wt 138 lb 9.6 oz (62.869 kg)  BMI 19.34 kg/m2  SpO2 99% Physical Exam  Constitutional: He is oriented to person, place, and time. He appears well-developed and well-nourished.  HENT:  Head:  Normocephalic and atraumatic.  Right Ear: Tympanic membrane normal.  Left Ear: Tympanic membrane normal.  Lower 2nd molar is decayed to the gum line. There is erythema of the gum surrounding the tooth. Tender on exam.   Eyes: Conjunctivae and EOM are normal. Pupils are equal, round, and reactive to light.  Neck: Normal range of motion. Neck supple.  Cardiovascular: Normal rate, regular rhythm and normal heart sounds.   Pulmonary/Chest: Effort normal and breath sounds normal. No respiratory distress.  Lungs CTA.  Abdominal: Soft. He exhibits no distension. There is no tenderness.  Musculoskeletal: Normal range  of motion. He exhibits no edema.  Lymphadenopathy:    He has cervical adenopathy ( mild ).  Neurological: He is alert and oriented to person, place, and time.  Skin: Skin is warm and dry.  There is an oval area to the mid upper abdomen with a few red areas surrounding it. There is no red streaking. No signs of infection. The area is consistent with insect bite with mild localized reaction.   Psychiatric: He has a normal mood and affect. His behavior is normal.  Nursing note and vitals reviewed.   ED Course  Procedures (including critical care time) DIAGNOSTIC STUDIES: Oxygen Saturation is 99% on RA, normal by my interpretation.    COORDINATION OF CARE: 6:25 PM Discussed treatment plan with pt at bedside and pt agreed to plan.    MDM  24 y.o. male with dental pain due to caries and local reaction to insect bite. Stable for d/c without trismus, no difficulty swallowing and does not appear toxic. Will start antibiotics and NSAIDS. Patient fo follow up with his dentist as soon as possible. Discussed with the patient and all questioned fully answered. He will call me if any problems arise.   Final diagnoses:  Insect bite of abdomen with local reaction, initial encounter  Pain due to dental caries   I personally performed the services described in this documentation, which was scribed in my presence. The recorded information has been reviewed and is accurate.   [place], [place] 03/14/15 1232  Lorre Nick, MD 03/15/15 [phone]

## 2017-04-16 ENCOUNTER — Emergency Department (HOSPITAL_COMMUNITY): Payer: Medicaid Other

## 2017-04-16 ENCOUNTER — Emergency Department (HOSPITAL_COMMUNITY)
Admission: EM | Admit: 2017-04-16 | Discharge: 2017-04-16 | Disposition: A | Discharge status: Home / Self Care | Payer: Medicaid Other | Attending: Emergency Medicine | Admitting: Emergency Medicine

## 2017-04-16 ENCOUNTER — Other Ambulatory Visit: Payer: Self-pay

## 2017-04-16 ENCOUNTER — Encounter (HOSPITAL_COMMUNITY): Payer: Self-pay | Admitting: Emergency Medicine

## 2017-04-16 DIAGNOSIS — J45909 Unspecified asthma, uncomplicated: Secondary | ICD-10-CM | POA: Insufficient documentation

## 2017-04-16 DIAGNOSIS — Z79899 Other long term (current) drug therapy: Secondary | ICD-10-CM | POA: Diagnosis not present

## 2017-04-16 DIAGNOSIS — R202 Paresthesia of skin: Secondary | ICD-10-CM | POA: Diagnosis not present

## 2017-04-16 DIAGNOSIS — F17210 Nicotine dependence, cigarettes, uncomplicated: Secondary | ICD-10-CM | POA: Insufficient documentation

## 2017-04-16 DIAGNOSIS — R079 Chest pain, unspecified: Secondary | ICD-10-CM | POA: Diagnosis present

## 2017-04-16 DIAGNOSIS — R0789 Other chest pain: Secondary | ICD-10-CM

## 2017-04-16 DIAGNOSIS — R200 Anesthesia of skin: Secondary | ICD-10-CM

## 2017-04-16 LAB — I-STAT TROPONIN, ED: Troponin i, poc: 0 ng/mL (ref 0.00–0.08)

## 2017-04-16 LAB — CBC
HCT: 39 % (ref 39.0–52.0)
HEMOGLOBIN: 13.7 g/dL (ref 13.0–17.0)
MCH: 31.1 pg (ref 26.0–34.0)
MCHC: 35.1 g/dL (ref 30.0–36.0)
MCV: 88.4 fL (ref 78.0–100.0)
Platelets: 291 10*3/uL (ref 150–400)
RBC: 4.41 MIL/uL (ref 4.22–5.81)
RDW: 12.6 % (ref 11.5–15.5)
WBC: 6.2 10*3/uL (ref 4.0–10.5)

## 2017-04-16 LAB — BASIC METABOLIC PANEL
Anion gap: 11 (ref 5–15)
BUN: 13 mg/dL (ref 6–20)
CO2: 21 mmol/L — ABNORMAL LOW (ref 22–32)
CREATININE: 0.68 mg/dL (ref 0.61–1.24)
Calcium: 9.1 mg/dL (ref 8.9–10.3)
Chloride: 106 mmol/L (ref 101–111)
GFR calc non Af Amer: >60 mL/min (ref >60)
Glucose, Bld: 88 mg/dL (ref 65–99)
Potassium: 4.1 mmol/L (ref 3.5–5.1)
Sodium: 138 mmol/L (ref 135–145)

## 2017-04-16 MED ORDER — NAPROXEN 500 MG PO TABS
500.0000 mg | ORAL_TABLET | Freq: Two times a day (BID) | ORAL | 0 refills | Status: DC
Start: 2017-04-16 — End: 2017-06-12

## 2017-04-16 MED ORDER — IOPAMIDOL (ISOVUE-370) INJECTION 76%
INTRAVENOUS | Status: AC
Start: 2017-04-16 — End: 2017-04-16
  Administered 2017-04-16: 100 mL
  Filled 2017-04-16: qty 100

## 2017-04-16 NOTE — Discharge Instructions (Signed)
You need a recheck with a family doctor to make sure your symptoms are improving with treatment.  [facility] to [facility] to register to start treatment, there is also a referral number in your discharge instructions as well as a resource guide for the clinics.

## 2017-04-16 NOTE — ED Provider Notes (Signed)
[facility]-EMERGENCY DEPT Provider Note   CSN: [id number] Arrival date & time: 04/16/17  1700     History   Chief Complaint Chief Complaint  Patient presents with  . Chest Pain    HPI Shawn Bolton is a 26 y.o. male.  HPI Patient began chest pain for about 3 weeks. It has been intermittent in nature. He reports it is a sharp and pressure quality pain on the left side of his chest. When he experiences this, he also notes that he gets a numb feeling on the left side of his body. This is the arm and leg as well as the face. He denies he's had extremity dysfunction. This waxes and wanes with the chest pain. Patient reports however he can be active doing outdoor work and does not get symptomatic with chest pain weakness numbness or tingling. No fever, cough, chills or lower extremity swelling. Patient denies drug abuse. He denies alcohol use. 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 Past Medical History:  Diagnosis Date  . Asthma     There are no active problems to display for this patient.   History reviewed. No pertinent surgical history.     Home Medications    Prior to Admission medications   Medication Sig Start Date End Date Taking? Authorizing Provider  acetaminophen (TYLENOL) 325 MG tablet Take 325 mg by mouth every 8 (eight) hours as needed (for pain or headaches).   Yes [provider]  Buprenorphine HCl-Naloxone HCl (SUBOXONE) 8-2 MG FILM Place 1 Film under the tongue daily.   Yes [provider]  amoxicillin (AMOXIL) 500 MG capsule Take 1 capsule (500 mg total) by mouth 3 (three) times daily. Patient not taking: Reported on 04/16/2017 03/12/15   Janne Napoleon, NP  cloNIDine (CATAPRES) 0.1 MG tablet Take 1 tablet (0.1 mg total) by mouth 2 (two) times daily. Patient not taking: Reported on 03/12/2015 09/25/13   Teressa Lower, NP  naproxen (NAPROSYN) 500 MG tablet  Take 1 tablet (500 mg total) by mouth 2 (two) times daily. Patient not taking: Reported on 04/16/2017 03/12/15   Janne Napoleon, NP  naproxen (NAPROSYN) 500 MG tablet Take 1 tablet (500 mg total) by mouth 2 (two) times daily. 04/16/17   Arby Barrette, MD  ondansetron (ZOFRAN) 4 MG tablet Take 1 tablet (4 mg total) by mouth every 6 (six) hours. Patient not taking: Reported on 04/16/2017 09/25/13   Teressa Lower, NP    Family History No family history on file.  Social History Social History  Substance Use Topics  . Smoking status: Current Every Day Smoker    Types: Cigarettes  . Smokeless tobacco: Not on file  . Alcohol use No     Allergies   Aspirin   Review of Systems Review of Systems 10 Systems reviewed and are negative for acute change except as noted in the HPI.   Physical Exam Updated Vital Signs BP (!) 146/84   Pulse 65   Temp 98.3 F (36.8 C) (Oral)   Resp 10   SpO2 99%   Physical Exam  Constitutional: He is oriented to person, place, and time. He appears well-developed and well-nourished. No distress.  HENT:  Head: Normocephalic and atraumatic.  Nose: Nose normal.  Mouth/Throat: Oropharynx is clear and moist.  Eyes: Pupils are equal, round, and reactive to light. Conjunctivae and EOM are normal.  Neck: Neck supple.  Cardiovascular: Normal  rate, regular rhythm, normal heart sounds and intact distal pulses.   No murmur heard. Pulmonary/Chest: Effort normal and breath sounds normal. No respiratory distress. He exhibits no tenderness.  Abdominal: Soft. He exhibits no distension. There is no tenderness.  Musculoskeletal: Normal range of motion. He exhibits no edema, tenderness or deformity.  Extremities are in good condition. No signs of cellulitis, injury, abscess. Lower extremities, no calf tenderness no peripheral edema.  Neurological: He is alert and oriented to person, place, and time. No cranial nerve deficit. He exhibits normal muscle tone. Coordination  normal.  Skin: Skin is warm and dry.  Psychiatric: He has a normal mood and affect.  Nursing note and vitals reviewed.    ED Treatments / Results  Labs (all labs ordered are listed, but only abnormal results are displayed) Labs Reviewed  BASIC METABOLIC PANEL - Abnormal; Notable for the following:       Result Value   CO2 21 (*)    All other components within normal limits  CBC  I-STAT TROPONIN, ED    EKG  EKG Interpretation  Date/Time:  Thursday April 16 2017 17:09:43 EDT Ventricular Rate:  71 PR Interval:  150 QRS Duration: 90 QT Interval:  352 QTC Calculation: 382 R Axis:   87 Text Interpretation:  Normal sinus rhythm Normal ECG Confirmed by Arby BarrettePfeiffer, Temima Kutsch [phone]([id number]) on 04/16/2017 8:55:54 PM       Radiology Dg Chest 2 View  Result Date: 04/16/2017 CLINICAL DATA:  26 year old male with a history of left-sided chest pain EXAM: CHEST  2 VIEW COMPARISON:  None. FINDINGS: The heart size and mediastinal contours are within normal limits. Both lungs are clear. The visualized skeletal structures are unremarkable. IMPRESSION: No radiographic evidence of acute cardiopulmonary disease. Electronically Signed   By: Gilmer MorJaime  Wagner D.O.   On: 04/16/2017 18:26   Ct Angio Chest/abd/pel For Dissection W And/or W/wo  Result Date: 04/16/2017 CLINICAL DATA:  Left-sided chest pain with numbness to the arms, chest, and face for about 2 months. Worse over the last 3 weeks. EXAM: CT ANGIOGRAPHY CHEST, ABDOMEN AND PELVIS TECHNIQUE: Multidetector CT imaging through the chest, abdomen and pelvis was performed using the standard protocol during bolus administration of intravenous contrast. Multiplanar reconstructed images and MIPs were obtained and reviewed to evaluate the vascular anatomy. CONTRAST:  100 mL Isovue 370 COMPARISON:  None. FINDINGS: CTA CHEST FINDINGS Cardiovascular: Noncontrast images of the chest demonstrate no evidence of intramural hematoma. Preferential opacification of the thoracic  aorta. No evidence of thoracic aortic aneurysm or dissection. Normal heart size. No pericardial effusion. Pulmonary arteries are well opacified without evidence of significant pulmonary embolus. Mediastinum/Nodes: No enlarged mediastinal, hilar, or axillary lymph nodes. Thyroid gland, trachea, and esophagus demonstrate no significant findings. Lungs/Pleura: Lungs are clear. No pleural effusion or pneumothorax. Musculoskeletal: No chest wall abnormality. No acute or significant osseous findings. Review of the MIP images confirms the above findings. CTA ABDOMEN AND PELVIS FINDINGS VASCULAR Aorta: Normal caliber aorta without aneurysm, dissection, vasculitis or significant stenosis. Celiac: Patent without evidence of aneurysm, dissection, vasculitis or significant stenosis. SMA: Patent without evidence of aneurysm, dissection, vasculitis or significant stenosis. Renals: Both renal arteries are patent without evidence of aneurysm, dissection, vasculitis, fibromuscular dysplasia or significant stenosis. IMA: Patent without evidence of aneurysm, dissection, vasculitis or significant stenosis. Inflow: Patent without evidence of aneurysm, dissection, vasculitis or significant stenosis. Veins: No obvious venous abnormality within the limitations of this arterial phase study. Review of the MIP images confirms the above findings. NON-VASCULAR Hepatobiliary:  No focal liver abnormality is seen. No gallstones, gallbladder wall thickening, or biliary dilatation. Pancreas: Unremarkable. No pancreatic ductal dilatation or surrounding inflammatory changes. Spleen: Normal in size without focal abnormality. Adrenals/Urinary Tract: Adrenal glands are unremarkable. Kidneys are normal, without renal calculi, focal lesion, or hydronephrosis. Bladder is unremarkable. Stomach/Bowel: Stomach is within normal limits. Appendix appears normal. No evidence of bowel wall thickening, distention, or inflammatory changes. Lymphatic: No significant  lymphadenopathy. Reproductive: Prostate is unremarkable. Other: No abdominal wall hernia or abnormality. No abdominopelvic ascites. Musculoskeletal: No acute or significant osseous findings. Review of the MIP images confirms the above findings. IMPRESSION: No evidence of aneurysm or dissection of the thoracic or abdominal aorta. No evidence of active pulmonary disease. No acute process demonstrated in the abdomen or pelvis. Electronically Signed   By: Burman NievesWilliam  Stevens M.D.   On: 04/16/2017 22:24    Procedures Procedures (including critical care time)  Medications Ordered in ED Medications  iopamidol (ISOVUE-370) 76 % injection (100 mLs  Contrast Given 04/16/17 2200)     Initial Impression / Assessment and Plan / ED Course  I have reviewed the triage vital signs and the nursing notes.  Pertinent labs & imaging results that were available during my care of the patient were reviewed by me and considered in my medical decision making (see chart for details).      Final Clinical Impressions(s) / ED Diagnoses   Final diagnoses:  Atypical chest pain  Left sided numbness  Patient atypical chest pain which was sharp in quality coming and going for several weeks. It is not more symptomatic with physical exertion. Symptoms are usually occurring at rest or with very mild activity. Patient did report getting unilateral numbness sensation on the left side in conjunction with pain. Due to combination of both chest pain and neurologic symptoms, CT chest was obtained to rule out any evidence of dissection. CT chest was negative. At this time will opt to treat with naproxen for likely musculoskeletal chest pain. It is unclear as to why patient is experiencing numbness of the same time. He is completely intact neurologically. Patient's counselor close follow-up and return precautions reviewed.   New Prescriptions New Prescriptions   NAPROXEN (NAPROSYN) 500 MG TABLET    Take 1 tablet (500 mg total) by mouth  2 (two) times daily.     Arby BarrettePfeiffer, Roshana Shuffield, MD 04/16/17 2352

## 2017-04-16 NOTE — ED Triage Notes (Signed)
Pt c/o CP x3 weeks, states it comes and goes, sometimes his arms feel numb.

## 2017-06-12 ENCOUNTER — Emergency Department (HOSPITAL_COMMUNITY): Payer: Medicaid Other

## 2017-06-12 ENCOUNTER — Emergency Department (HOSPITAL_COMMUNITY)
Admission: EM | Admit: 2017-06-12 | Discharge: 2017-06-12 | Disposition: A | Discharge status: Home / Self Care | Payer: Medicaid Other | Attending: Emergency Medicine | Admitting: Emergency Medicine

## 2017-06-12 ENCOUNTER — Encounter (HOSPITAL_COMMUNITY): Payer: Self-pay | Admitting: Emergency Medicine

## 2017-06-12 DIAGNOSIS — F17210 Nicotine dependence, cigarettes, uncomplicated: Secondary | ICD-10-CM | POA: Diagnosis not present

## 2017-06-12 DIAGNOSIS — Y939 Activity, unspecified: Secondary | ICD-10-CM | POA: Diagnosis not present

## 2017-06-12 DIAGNOSIS — Y929 Unspecified place or not applicable: Secondary | ICD-10-CM | POA: Insufficient documentation

## 2017-06-12 DIAGNOSIS — S91332A Puncture wound without foreign body, left foot, initial encounter: Secondary | ICD-10-CM | POA: Diagnosis not present

## 2017-06-12 DIAGNOSIS — Y999 Unspecified external cause status: Secondary | ICD-10-CM | POA: Diagnosis not present

## 2017-06-12 DIAGNOSIS — Z79899 Other long term (current) drug therapy: Secondary | ICD-10-CM | POA: Insufficient documentation

## 2017-06-12 DIAGNOSIS — J45909 Unspecified asthma, uncomplicated: Secondary | ICD-10-CM | POA: Diagnosis not present

## 2017-06-12 DIAGNOSIS — S99922A Unspecified injury of left foot, initial encounter: Secondary | ICD-10-CM | POA: Diagnosis present

## 2017-06-12 DIAGNOSIS — W450XXA Nail entering through skin, initial encounter: Secondary | ICD-10-CM | POA: Diagnosis not present

## 2017-06-12 DIAGNOSIS — Z23 Encounter for immunization: Secondary | ICD-10-CM | POA: Insufficient documentation

## 2017-06-12 LAB — CBC WITH DIFFERENTIAL/PLATELET
Basophils Absolute: 0 10*3/uL (ref 0.0–0.1)
Basophils Relative: 0 %
EOS ABS: 0 10*3/uL (ref 0.0–0.7)
EOS PCT: 1 %
HCT: 41.4 % (ref 39.0–52.0)
HEMOGLOBIN: 14.4 g/dL (ref 13.0–17.0)
LYMPHS ABS: 2.1 10*3/uL (ref 0.7–4.0)
LYMPHS PCT: 25 %
MCH: 31.8 pg (ref 26.0–34.0)
MCHC: 34.8 g/dL (ref 30.0–36.0)
MCV: 91.4 fL (ref 78.0–100.0)
MONOS PCT: 9 %
Monocytes Absolute: 0.8 10*3/uL (ref 0.1–1.0)
Neutro Abs: 5.5 10*3/uL (ref 1.7–7.7)
Neutrophils Relative %: 65 %
Platelets: 239 10*3/uL (ref 150–400)
RBC: 4.53 MIL/uL (ref 4.22–5.81)
RDW: 12.6 % (ref 11.5–15.5)
WBC: 8.5 10*3/uL (ref 4.0–10.5)

## 2017-06-12 LAB — COMPREHENSIVE METABOLIC PANEL
ALBUMIN: 4.6 g/dL (ref 3.5–5.0)
ALT: 13 U/L — AB (ref 17–63)
AST: 21 U/L (ref 15–41)
Alkaline Phosphatase: 58 U/L (ref 38–126)
Anion gap: 12 (ref 5–15)
BUN: 13 mg/dL (ref 6–20)
CHLORIDE: 103 mmol/L (ref 101–111)
CO2: 21 mmol/L — AB (ref 22–32)
CREATININE: 0.66 mg/dL (ref 0.61–1.24)
Calcium: 9.2 mg/dL (ref 8.9–10.3)
GFR calc Af Amer: >60 mL/min (ref >60)
GFR calc non Af Amer: >60 mL/min (ref >60)
Glucose, Bld: 81 mg/dL (ref 65–99)
POTASSIUM: 4 mmol/L (ref 3.5–5.1)
SODIUM: 136 mmol/L (ref 135–145)
Total Bilirubin: 0.9 mg/dL (ref 0.3–1.2)
Total Protein: 7.5 g/dL (ref 6.5–8.1)

## 2017-06-12 LAB — I-STAT CG4 LACTIC ACID, ED: LACTIC ACID, VENOUS: 1.24 mmol/L (ref 0.5–1.9)

## 2017-06-12 MED ORDER — CIPROFLOXACIN IN D5W 400 MG/200ML IV SOLN
400.0000 mg | Freq: Once | INTRAVENOUS | Status: DC
  Filled 2017-06-12: qty 200

## 2017-06-12 MED ORDER — TETANUS-DIPHTH-ACELL PERTUSSIS 5-2.5-18.5 LF-MCG/0.5 IM SUSP
0.5000 mL | Freq: Once | INTRAMUSCULAR | Status: AC
  Administered 2017-06-12: 0.5 mL via INTRAMUSCULAR
  Filled 2017-06-12: qty 0.5

## 2017-06-12 MED ORDER — NAPROXEN 500 MG PO TABS: 500.0000 mg | ORAL_TABLET | Freq: Two times a day (BID) | ORAL | 0 refills | Status: DC

## 2017-06-12 MED ORDER — CIPROFLOXACIN HCL 500 MG PO TABS: 500.0000 mg | ORAL_TABLET | Freq: Two times a day (BID) | ORAL | 0 refills | Status: DC

## 2017-06-12 MED ORDER — CIPROFLOXACIN HCL 500 MG PO TABS
500.0000 mg | ORAL_TABLET | Freq: Once | ORAL | Status: AC
  Administered 2017-06-12: 500 mg via ORAL
  Filled 2017-06-12: qty 1

## 2017-06-12 NOTE — Discharge Instructions (Signed)
Take ciprofloxacin as prescribed until finished. Take naproxen as needed for pain and inflammation. Ice your foot 3-4 times per day to limit swelling. You may return for new or concerning symptoms.

## 2017-06-12 NOTE — ED Triage Notes (Signed)
Pt c/o foot pain, last night a board fell on his foot and a nail went through his shoe and punctured the top of his foot. Unknown when last tetanus shot, redness surrounding the puncture wound.

## 2017-06-12 NOTE — ED Provider Notes (Signed)
[facility] Provider Note   CSN: [id number] Arrival date & time: 06/12/17  1754     History   Chief Complaint Chief Complaint  Patient presents with  . Foot Pain    HPI Shawn Bolton is a 26 y.o. male.  26 year old male presents to the emergency department for evaluation of a puncture wound to his left foot. He states that a board fell on his foot yesterday and a nail went through his shoe onto the dorsum of his foot. He states that he has had progressive redness and swelling around the area of puncture. No associated fevers and patient cannot recall the date of his last tetanus shot. Patient reports worsening pain with weightbearing. No alleviating factors.      Past Medical History:  Diagnosis Date  . Asthma     There are no active problems to display for this patient.   History reviewed. No pertinent surgical history.     Home Medications    Prior to Admission medications   Medication Sig Start Date End Date Taking? Authorizing Provider  acetaminophen (TYLENOL) 325 MG tablet Take 325 mg by mouth every 8 (eight) hours as needed (for pain or headaches).    [provider]  amoxicillin (AMOXIL) 500 MG capsule Take 1 capsule (500 mg total) by mouth 3 (three) times daily. Patient not taking: Reported on 04/16/2017 03/12/15   Janne Napoleon, NP  Buprenorphine HCl-Naloxone HCl (SUBOXONE) 8-2 MG FILM Place 1 Film under the tongue daily.    [provider]  ciprofloxacin (CIPRO) 500 MG tablet Take 1 tablet (500 mg total) by mouth 2 (two) times daily. 06/12/17   Antony Madura, PA-C  cloNIDine (CATAPRES) 0.1 MG tablet Take 1 tablet (0.1 mg total) by mouth 2 (two) times daily. Patient not taking: Reported on 03/12/2015 09/25/13   Teressa Lower, NP  naproxen (NAPROSYN) 500 MG tablet Take 1 tablet (500 mg total) by mouth 2 (two) times daily. 06/12/17   Antony Madura, PA-C  ondansetron (ZOFRAN) 4 MG tablet Take 1 tablet (4 mg total) by mouth every 6  (six) hours. Patient not taking: Reported on 04/16/2017 09/25/13   Teressa Lower, NP    Family History No family history on file.  Social History Social History  Substance Use Topics  . Smoking status: Current Every Day Smoker    Types: Cigarettes  . Smokeless tobacco: Never Used  . Alcohol use No     Allergies   Aspirin   Review of Systems Review of Systems Ten systems reviewed and are negative for acute change, except as noted in the HPI.    Physical Exam Updated Vital Signs BP 131/72 (BP Location: Right Arm)   Pulse 87   Temp 98.1 F (36.7 C) (Oral)   Resp 18   SpO2 100%   Physical Exam  Constitutional: He is oriented to person, place, and time. He appears well-developed and well-nourished. No distress.  Nontoxic appearing and in no acute distress  HENT:  Head: Normocephalic and atraumatic.  Eyes: Conjunctivae and EOM are normal. No scleral icterus.  Neck: Normal range of motion.  Cardiovascular: Normal rate, regular rhythm and intact distal pulses.   DP pulse 2+ in the LLE  Pulmonary/Chest: Effort normal. No respiratory distress.  Respirations even and unlabored  Musculoskeletal: Normal range of motion.  Neurological: He is alert and oriented to person, place, and time. He exhibits normal muscle tone. Coordination normal.  Sensation to light touch intact in the left lower extremity. Patient  able to wiggle all toes.  Skin: Skin is warm and dry. No rash noted. He is not diaphoretic. No erythema. No pallor.  Psychiatric: He has a normal mood and affect. His behavior is normal.  Nursing note and vitals reviewed.    ED Treatments / Results  Labs (all labs ordered are listed, but only abnormal results are displayed) Labs Reviewed  COMPREHENSIVE METABOLIC PANEL - Abnormal; Notable for the following:       Result Value   CO2 21 (*)    ALT 13 (*)    All other components within normal limits  CBC WITH DIFFERENTIAL/PLATELET  I-STAT CG4 LACTIC ACID, ED     EKG  EKG Interpretation None       Radiology Dg Foot Complete Left  Result Date: 06/12/2017 CLINICAL DATA:  Pt c/o foot pain, last night a board fell on his foot and a nail went through his shoe and punctured the top of his foot. Unknown when last tetanus shot, redness surrounding the puncture wound EXAM: LEFT FOOT - COMPLETE 3+ VIEW COMPARISON:  None. FINDINGS: There is no evidence of fracture or dislocation. There is no evidence of arthropathy or other focal bone abnormality. Soft tissues are unremarkable. No foreign body IMPRESSION: No fracture or foreign body. Electronically Signed   By: Genevive Bi M.D.   On: 06/12/2017 19:13    Procedures Procedures (including critical care time)  Medications Ordered in ED Medications  Tdap (BOOSTRIX) injection 0.5 mL (0.5 mLs Intramuscular Given 06/12/17 2233)  ciprofloxacin (CIPRO) tablet 500 mg (500 mg Oral Given 06/12/17 2244)      Initial Impression / Assessment and Plan / ED Course  I have reviewed the triage vital signs and the nursing notes.  Pertinent labs & imaging results that were available during my care of the patient were reviewed by me and considered in my medical decision making (see chart for details).     26 year old male presents to the emergency department for evaluation of a puncture wound which occurred last night. There is evidence of progressing cellulitis. No induration, fluctuance, drainage to suggest abscess. Patient neurovascularly intact. I advised IV ciprofloxacin which patient initially agreed to; however, he later declined IV medication as he thought it would take too long to receive. He states that he wants to go home and take oral antibiotics instead. I have explained that it takes 24-48 hours for oral antibiotics to start becoming effective and that IV antibiotics would ensure faster improvement. Patient verbalizes understanding, but continues to decline use of oral antibiotics. He verbalizes  understanding that he may have some symptom worsening over the next 24 hours. He has been instructed on RICE and to return as needed for new or concerning symptoms. Return precautions discussed and provided. Patient discharged in stable condition with no unaddressed concerns.   Final Clinical Impressions(s) / ED Diagnoses   Final diagnoses:  Puncture wound of left foot, initial encounter    New Prescriptions New Prescriptions   CIPROFLOXACIN (CIPRO) 500 MG TABLET    Take 1 tablet (500 mg total) by mouth 2 (two) times daily.     Antony Madura, PA-C 06/12/17 Lamar Blinks    Shaune Pollack, MD 06/13/17 1113

## 2017-06-15 ENCOUNTER — Encounter (HOSPITAL_COMMUNITY): Payer: Self-pay | Admitting: Emergency Medicine

## 2017-06-15 ENCOUNTER — Emergency Department (HOSPITAL_COMMUNITY)
Admission: EM | Admit: 2017-06-15 | Discharge: 2017-06-15 | Disposition: A | Discharge status: Home / Self Care | Payer: Medicaid Other | Attending: Emergency Medicine | Admitting: Emergency Medicine

## 2017-06-15 ENCOUNTER — Emergency Department (HOSPITAL_COMMUNITY): Payer: Medicaid Other

## 2017-06-15 DIAGNOSIS — M25512 Pain in left shoulder: Secondary | ICD-10-CM

## 2017-06-15 DIAGNOSIS — J45909 Unspecified asthma, uncomplicated: Secondary | ICD-10-CM | POA: Diagnosis not present

## 2017-06-15 DIAGNOSIS — M79662 Pain in left lower leg: Secondary | ICD-10-CM | POA: Insufficient documentation

## 2017-06-15 DIAGNOSIS — M25532 Pain in left wrist: Secondary | ICD-10-CM

## 2017-06-15 DIAGNOSIS — M546 Pain in thoracic spine: Secondary | ICD-10-CM

## 2017-06-15 DIAGNOSIS — Z79899 Other long term (current) drug therapy: Secondary | ICD-10-CM | POA: Diagnosis not present

## 2017-06-15 DIAGNOSIS — Y9389 Activity, other specified: Secondary | ICD-10-CM | POA: Insufficient documentation

## 2017-06-15 DIAGNOSIS — Y92410 Unspecified street and highway as the place of occurrence of the external cause: Secondary | ICD-10-CM | POA: Diagnosis not present

## 2017-06-15 DIAGNOSIS — Y999 Unspecified external cause status: Secondary | ICD-10-CM | POA: Insufficient documentation

## 2017-06-15 DIAGNOSIS — M545 Low back pain, unspecified: Secondary | ICD-10-CM

## 2017-06-15 DIAGNOSIS — R0789 Other chest pain: Secondary | ICD-10-CM

## 2017-06-15 DIAGNOSIS — S161XXA Strain of muscle, fascia and tendon at neck level, initial encounter: Secondary | ICD-10-CM | POA: Diagnosis not present

## 2017-06-15 DIAGNOSIS — S199XXA Unspecified injury of neck, initial encounter: Secondary | ICD-10-CM | POA: Diagnosis present

## 2017-06-15 MED ORDER — CYCLOBENZAPRINE HCL 10 MG PO TABS: 10.0000 mg | ORAL_TABLET | Freq: Two times a day (BID) | ORAL | 0 refills | Status: DC | PRN

## 2017-06-15 MED ORDER — NAPROXEN 375 MG PO TABS: 375.0000 mg | ORAL_TABLET | Freq: Two times a day (BID) | ORAL | 0 refills | Status: DC

## 2017-06-15 MED ORDER — HYDROCODONE-ACETAMINOPHEN 5-325 MG PO TABS
1.0000 | ORAL_TABLET | Freq: Once | ORAL | Status: DC
  Filled 2017-06-15: qty 1

## 2017-06-15 MED ORDER — ACETAMINOPHEN 500 MG PO TABS
1000.0000 mg | ORAL_TABLET | Freq: Once | ORAL | Status: AC
  Administered 2017-06-15: 1000 mg via ORAL
  Filled 2017-06-15: qty 2

## 2017-06-15 MED ORDER — CYCLOBENZAPRINE HCL 10 MG PO TABS
10.0000 mg | ORAL_TABLET | Freq: Once | ORAL | Status: AC
Start: 2017-06-15 — End: 2017-06-15
  Administered 2017-06-15: 10 mg via ORAL
  Filled 2017-06-15: qty 1

## 2017-06-15 NOTE — ED Notes (Signed)
Pt limping while ambulating to room.
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
## 2017-06-15 NOTE — Discharge Instructions (Signed)
All other imaging has been reassuring. Likely will experience musculoskeletal pain. Warm compresses to the affected area. Warm soaks in Epsom salt.  Please the the flexeril for muscle relaxation. This medication will make you drowsy so avoid situation that could place you in danger.   Please take the Naproxen as prescribed for pain. Do not take any additional NSAIDs including Motrin, Aleve, Ibuprofen, Advil.  Please follow-up with the primary care doctor. Return to the ED if your symptoms worsen.  Workup has been normal. Please take medications as prescribed and instructed.  SEEK IMMEDIATE MEDICAL ATTENTION IF: New numbness, tingling, weakness, or problem with the use of your arms or legs.  Severe back pain not relieved with medications.  Change in bowel or bladder control.  Urinary retention.  Numbness in your groin.  Increasing pain in any areas of the body (such as chest or abdominal pain).  Shortness of breath, dizziness or fainting.  Nausea (feeling sick to your stomach), vomiting, fever, or sweats.
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
## 2017-07-05 ENCOUNTER — Encounter: Payer: Self-pay | Admitting: *Deleted

## 2017-07-05 ENCOUNTER — Emergency Department: Payer: Medicaid Other

## 2017-07-05 ENCOUNTER — Emergency Department
Admission: EM | Admit: 2017-07-05 | Discharge: 2017-07-05 | Disposition: A | Discharge status: Home / Self Care | Payer: Medicaid Other | Attending: Emergency Medicine | Admitting: Emergency Medicine

## 2017-07-05 DIAGNOSIS — F17210 Nicotine dependence, cigarettes, uncomplicated: Secondary | ICD-10-CM | POA: Insufficient documentation

## 2017-07-05 DIAGNOSIS — Z79899 Other long term (current) drug therapy: Secondary | ICD-10-CM | POA: Insufficient documentation

## 2017-07-05 DIAGNOSIS — R0789 Other chest pain: Secondary | ICD-10-CM | POA: Insufficient documentation

## 2017-07-05 DIAGNOSIS — J45909 Unspecified asthma, uncomplicated: Secondary | ICD-10-CM | POA: Diagnosis not present

## 2017-07-05 DIAGNOSIS — R109 Unspecified abdominal pain: Secondary | ICD-10-CM | POA: Diagnosis present

## 2017-07-05 LAB — CBC
HEMATOCRIT: 43.8 % (ref 40.0–52.0)
HEMOGLOBIN: 15.3 g/dL (ref 13.0–18.0)
MCH: 32.3 pg (ref 26.0–34.0)
MCHC: 34.9 g/dL (ref 32.0–36.0)
MCV: 92.7 fL (ref 80.0–100.0)
Platelets: 286 10*3/uL (ref 150–440)
RBC: 4.73 MIL/uL (ref 4.40–5.90)
RDW: 12.5 % (ref 11.5–14.5)
WBC: 6.6 10*3/uL (ref 3.8–10.6)

## 2017-07-05 LAB — TROPONIN I

## 2017-07-05 LAB — BASIC METABOLIC PANEL
ANION GAP: 11 (ref 5–15)
BUN: 12 mg/dL (ref 6–20)
CALCIUM: 9.4 mg/dL (ref 8.9–10.3)
CO2: 25 mmol/L (ref 22–32)
Chloride: 103 mmol/L (ref 101–111)
Creatinine, Ser: 0.76 mg/dL (ref 0.61–1.24)
GLUCOSE: 110 mg/dL — AB (ref 65–99)
POTASSIUM: 3.8 mmol/L (ref 3.5–5.1)
Sodium: 139 mmol/L (ref 135–145)

## 2017-07-05 NOTE — ED Notes (Signed)
Pt says that he is here for chest pain for a month and left side numbness in the arm and face. It is intermittent, pt is stressed. Pt reports sharp pains to heart. Pt was sick 2 weeks ago with fever, throwing up but recovered on his own. Pt denies itching. Grips are qual bilaterally. Denies eye twitching, no itching or redness.

## 2017-07-05 NOTE — ED Triage Notes (Signed)
Pt to ED reporting left sided  Chest pain intermittently for over a  Month. PT reports pain is radiating down left arm. Pt is dizzy and lightheaded with nausea and SOB reported. Pts family reports he has intermittent spells  Of diaphoresis and "turning a grey color" and almost having a syncopal episode. Pt reports he has a heart murmur but denies other heart hx. No cough or fever reported at this time.
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
## 2017-07-05 NOTE — Discharge Instructions (Signed)
there is increased pain, shortness of breath, change in chronic pain, or any other new or worrisome symptoms return to the emergency department.

## 2017-07-05 NOTE — ED Triage Notes (Signed)
Family to stat desk asking about wait time. Family updated on wait time.  

## 2020-09-20 ENCOUNTER — Emergency Department (HOSPITAL_COMMUNITY): Payer: Medicaid Other

## 2020-09-20 ENCOUNTER — Emergency Department (HOSPITAL_COMMUNITY)
Admission: EM | Admit: 2020-09-20 | Discharge: 2020-09-20 | Disposition: A | Discharge status: Home / Self Care | Payer: Medicaid Other | Attending: Emergency Medicine | Admitting: Emergency Medicine

## 2020-09-20 ENCOUNTER — Other Ambulatory Visit: Payer: Self-pay

## 2020-09-20 DIAGNOSIS — F17210 Nicotine dependence, cigarettes, uncomplicated: Secondary | ICD-10-CM | POA: Diagnosis not present

## 2020-09-20 DIAGNOSIS — G9340 Encephalopathy, unspecified: Secondary | ICD-10-CM | POA: Insufficient documentation

## 2020-09-20 DIAGNOSIS — T50901A Poisoning by unspecified drugs, medicaments and biological substances, accidental (unintentional), initial encounter: Secondary | ICD-10-CM | POA: Diagnosis present

## 2020-09-20 DIAGNOSIS — F1910 Other psychoactive substance abuse, uncomplicated: Secondary | ICD-10-CM

## 2020-09-20 DIAGNOSIS — J45909 Unspecified asthma, uncomplicated: Secondary | ICD-10-CM | POA: Insufficient documentation

## 2020-09-20 DIAGNOSIS — R4182 Altered mental status, unspecified: Secondary | ICD-10-CM | POA: Diagnosis not present

## 2020-09-20 LAB — CBC WITH DIFFERENTIAL/PLATELET
Abs Immature Granulocytes: 0.01 10*3/uL (ref 0.00–0.07)
Basophils Absolute: 0 10*3/uL (ref 0.0–0.1)
Basophils Relative: 1 %
Eosinophils Absolute: 0 10*3/uL (ref 0.0–0.5)
Eosinophils Relative: 1 %
HCT: 36.4 % — ABNORMAL LOW (ref 39.0–52.0)
Hemoglobin: 12.6 g/dL — ABNORMAL LOW (ref 13.0–17.0)
Immature Granulocytes: 0 %
Lymphocytes Relative: 36 %
Lymphs Abs: 1.6 10*3/uL (ref 0.7–4.0)
MCH: 31.7 pg (ref 26.0–34.0)
MCHC: 34.6 g/dL (ref 30.0–36.0)
MCV: 91.5 fL (ref 80.0–100.0)
Monocytes Absolute: 0.5 10*3/uL (ref 0.1–1.0)
Monocytes Relative: 11 %
Neutro Abs: 2.3 10*3/uL (ref 1.7–7.7)
Neutrophils Relative %: 51 %
Platelets: 262 10*3/uL (ref 150–400)
RBC: 3.98 MIL/uL — ABNORMAL LOW (ref 4.22–5.81)
RDW: 12 % (ref 11.5–15.5)
WBC: 4.5 10*3/uL (ref 4.0–10.5)
nRBC: 0 % (ref 0.0–0.2)

## 2020-09-20 LAB — RAPID URINE DRUG SCREEN, HOSP PERFORMED
Amphetamines: POSITIVE — AB
Barbiturates: NOT DETECTED
Benzodiazepines: POSITIVE — AB
Cocaine: NOT DETECTED
Opiates: NOT DETECTED
Tetrahydrocannabinol: POSITIVE — AB

## 2020-09-20 LAB — SALICYLATE LEVEL: Salicylate Lvl: <7 mg/dL — ABNORMAL LOW (ref 7.0–30.0)

## 2020-09-20 LAB — COMPREHENSIVE METABOLIC PANEL
ALT: 14 U/L (ref 0–44)
AST: 19 U/L (ref 15–41)
Albumin: 3.9 g/dL (ref 3.5–5.0)
Alkaline Phosphatase: 52 U/L (ref 38–126)
Anion gap: 9 (ref 5–15)
BUN: 13 mg/dL (ref 6–20)
CO2: 24 mmol/L (ref 22–32)
Calcium: 8.8 mg/dL — ABNORMAL LOW (ref 8.9–10.3)
Chloride: 102 mmol/L (ref 98–111)
Creatinine, Ser: 0.77 mg/dL (ref 0.61–1.24)
GFR, Estimated: >60 mL/min (ref >60)
Glucose, Bld: 92 mg/dL (ref 70–99)
Potassium: 3.9 mmol/L (ref 3.5–5.1)
Sodium: 135 mmol/L (ref 135–145)
Total Bilirubin: 0.6 mg/dL (ref 0.3–1.2)
Total Protein: 6.5 g/dL (ref 6.5–8.1)

## 2020-09-20 LAB — ETHANOL: Alcohol, Ethyl (B): <10 mg/dL (ref <10)

## 2020-09-20 LAB — AMMONIA: Ammonia: 40 umol/L — ABNORMAL HIGH (ref 9–35)

## 2020-09-20 LAB — ACETAMINOPHEN LEVEL: Acetaminophen (Tylenol), Serum: <10 ug/mL — ABNORMAL LOW (ref 10–30)

## 2020-09-20 MED ORDER — NALOXONE HCL 2 MG/2ML IJ SOSY
PREFILLED_SYRINGE | INTRAMUSCULAR | Status: AC
  Administered 2020-09-20: 03:00:00 1 mg via INTRAVENOUS
  Filled 2020-09-20: qty 2

## 2020-09-20 MED ORDER — SODIUM CHLORIDE 0.9 % IV BOLUS
1000.0000 mL | Freq: Once | INTRAVENOUS | Status: AC
  Administered 2020-09-20: 02:00:00 1000 mL via INTRAVENOUS

## 2020-09-20 MED ORDER — NALOXONE HCL 2 MG/2ML IJ SOSY
PREFILLED_SYRINGE | INTRAMUSCULAR | Status: AC
  Filled 2020-09-20: qty 2

## 2020-09-20 MED ORDER — NALOXONE HCL 2 MG/2ML IJ SOSY
1.0000 mg | PREFILLED_SYRINGE | INTRAMUSCULAR | Status: DC | PRN
  Administered 2020-09-20: 03:00:00 1 mg via INTRAVENOUS

## 2020-09-20 NOTE — ED Notes (Signed)
Provider at bedside and was told to give other 1mg  of narcan.
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
## 2020-09-20 NOTE — ED Notes (Signed)
1mg  of narcan given. Pt respiratory count to 8 and now 11. Patient is also hypotensive. Fluid rate increased and provider made aware.

## 2020-09-20 NOTE — ED Notes (Signed)
Patient discharge instructions reviewed with the patient. The patient verbalized understanding of instructions. Patient discharged. 
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
## 2020-09-20 NOTE — ED Provider Notes (Signed)
[facility] EMERGENCY DEPARTMENT Provider Note   CSN: [id number] Arrival date & time: 09/20/20  0108     History Chief Complaint  Patient presents with  . Drug Overdose    Shawn Bolton is a 29 y.o. male.  Patient brought to the emergency department with suspected overdose.  Patient was reportedly pulled over by police tonight and initially was conversing with them but then became obtunded.  At arrival to the emergency department, patient responds to painful stimuli but does not follow commands or answer any questions.  Level 5 caveat due to mental status changes.        Past Medical History:  Diagnosis Date  . Asthma     There are no problems to display for this patient.   No past surgical history on file.     No family history on file.  Social History   Tobacco Use  . Smoking status: Current Every Day Smoker    Packs/day: 1.00    Types: Cigarettes  . Smokeless tobacco: Never Used  Substance Use Topics  . Alcohol use: No  . Drug use: Yes    Types: Marijuana    Home Medications Prior to Admission medications   Medication Sig Start Date End Date Taking? Authorizing Provider  acetaminophen (TYLENOL) 325 MG tablet Take 325 mg by mouth every 8 (eight) hours as needed (for pain or headaches).    [provider]  amoxicillin (AMOXIL) 500 MG capsule Take 1 capsule (500 mg total) by mouth 3 (three) times daily. Patient not taking: Reported on 04/16/2017 03/12/15   Janne Napoleon, NP  Buprenorphine HCl-Naloxone HCl (SUBOXONE) 8-2 MG FILM Place 1 Film under the tongue daily.    [provider]  ciprofloxacin (CIPRO) 500 MG tablet Take 1 tablet (500 mg total) by mouth 2 (two) times daily. 06/12/17   Antony Madura, PA-C  cloNIDine (CATAPRES) 0.1 MG tablet Take 1 tablet (0.1 mg total) by mouth 2 (two) times daily. Patient not taking: Reported on 03/12/2015 09/25/13   Teressa Lower, NP  cyclobenzaprine (FLEXERIL) 10 MG tablet  Take 1 tablet (10 mg total) by mouth 2 (two) times daily as needed for muscle spasms. 06/15/17   Rise Mu, PA-C  naproxen (NAPROSYN) 375 MG tablet Take 1 tablet (375 mg total) by mouth 2 (two) times daily. 06/15/17   Rise Mu, PA-C  ondansetron (ZOFRAN) 4 MG tablet Take 1 tablet (4 mg total) by mouth every 6 (six) hours. Patient not taking: Reported on 04/16/2017 09/25/13   Teressa Lower, NP    Allergies    Patient has no active allergies.  Review of Systems   Review of Systems  Unable to perform ROS: Mental status change    Physical Exam Updated Vital Signs BP (!) 124/94   Pulse (!) 54   Resp 11   Ht 6' (1.829 m)   Wt 68 kg   SpO2 98%   BMI 20.33 kg/m   Physical Exam Vitals and nursing note reviewed.  Constitutional:      General: He is not in acute distress.    Appearance: He is well-developed and well-nourished.  HENT:     Head: Normocephalic and atraumatic.     Right Ear: Hearing normal.     Left Ear: Hearing normal.     Nose: Nose normal.     Mouth/Throat:     Mouth: Oropharynx is clear and moist and mucous membranes are normal.  Eyes:     Extraocular  Movements: EOM normal.     Conjunctiva/sclera: Conjunctivae normal.     Comments: Pupils pinpoint  Cardiovascular:     Rate and Rhythm: Regular rhythm.     Heart sounds: S1 normal and S2 normal. No murmur heard. No friction rub. No gallop.   Pulmonary:     Effort: Pulmonary effort is normal. No respiratory distress.     Breath sounds: Normal breath sounds.  Chest:     Chest wall: No tenderness.  Abdominal:     General: Bowel sounds are normal.     Palpations: Abdomen is soft. There is no hepatosplenomegaly.     Tenderness: There is no abdominal tenderness. There is no guarding or rebound. Negative signs include Murphy's sign and McBurney's sign.     Hernia: No hernia is present.  Musculoskeletal:        General: Normal range of motion.     Cervical back: Normal range of motion and neck  supple.  Skin:    General: Skin is warm, dry and intact.     Findings: No rash.     Nails: There is no cyanosis.  Neurological:     GCS: GCS eye subscore is 1. GCS verbal subscore is 1. GCS motor subscore is 4.     Deep Tendon Reflexes: Strength normal.  Psychiatric:        Mood and Affect: Mood and affect normal.     ED Results / Procedures / Treatments   Labs (all labs ordered are listed, but only abnormal results are displayed) Labs Reviewed  CBC WITH DIFFERENTIAL/PLATELET - Abnormal; Notable for the following components:      Result Value   RBC 3.98 (*)    Hemoglobin 12.6 (*)    HCT 36.4 (*)    All other components within normal limits  COMPREHENSIVE METABOLIC PANEL - Abnormal; Notable for the following components:   Calcium 8.8 (*)    All other components within normal limits  RAPID URINE DRUG SCREEN, HOSP PERFORMED - Abnormal; Notable for the following components:   Benzodiazepines POSITIVE (*)    Amphetamines POSITIVE (*)    Tetrahydrocannabinol POSITIVE (*)    All other components within normal limits  AMMONIA - Abnormal; Notable for the following components:   Ammonia 40 (*)    All other components within normal limits  SALICYLATE LEVEL - Abnormal; Notable for the following components:   Salicylate Lvl <7.0 (*)    All other components within normal limits  ACETAMINOPHEN LEVEL - Abnormal; Notable for the following components:   Acetaminophen (Tylenol), Serum <10 (*)    All other components within normal limits  ETHANOL    EKG EKG Interpretation  Date/Time:  Thursday September 20 2020 04:25:35 EST Ventricular Rate:  63 PR Interval:    QRS Duration: 107 QT Interval:  401 QTC Calculation: 411 R Axis:   85 Text Interpretation: Sinus rhythm Consider left atrial enlargement Early repolarization No significant change since last tracing Confirmed by Gilda Crease [id number]) on 09/20/2020 4:29:03 AM   Radiology CT HEAD WO CONTRAST  Result Date:  09/20/2020 CLINICAL DATA:  Mental status change EXAM: CT HEAD WITHOUT CONTRAST TECHNIQUE: Contiguous axial images were obtained from the base of the skull through the vertex without intravenous contrast. COMPARISON:  None. FINDINGS: Brain: No evidence of acute territorial infarction, hemorrhage, hydrocephalus,extra-axial collection or mass lesion/mass effect. Normal gray-white differentiation. Ventricles are normal in size and contour. Vascular: No hyperdense vessel or unexpected calcification. Skull: The skull is intact. No fracture or  focal lesion identified. Sinuses/Orbits: There is near complete opacification of the left maxillary sinus. A small amount of mucosal thickening and debris seen within the ethmoid air cells and left maxillary sinus. Other: None IMPRESSION: No acute intracranial abnormality. Findings suggestive of left maxillary sinusitis Electronically Signed   By: Jonna Clark M.D.   On: 09/20/2020 01:53    Procedures Procedures (including critical care time)  Medications Ordered in ED Medications  naloxone [facility]) injection 1 mg (1 mg Intravenous Given 09/20/20 0231)  naloxone [facility]) 2 MG/2ML injection (has no administration in time range)  sodium chloride 0.9 % bolus 1,000 mL (0 mLs Intravenous Stopped 09/20/20 0238)    Followed by  sodium chloride 0.9 % bolus 1,000 mL (0 mLs Intravenous Stopped 09/20/20 0221)    ED Course  I have reviewed the triage vital signs and the nursing notes.  Pertinent labs & imaging results that were available during my care of the patient were reviewed by me and considered in my medical decision making (see chart for details).    MDM Rules/Calculators/A&P                          Patient brought to the emergency department after being pulled over for a traffic stop and then exhibiting altered mental status.  Overdose was suspected.  Patient did have pinpoint pupils with minimal response to painful stimuli at arrival.  Respiratory rate  slightly diminished but does have a gag reflex.  Does not require intubation.  Blood pressure slightly low at arrival, responded to IV fluids.  Drug screen positive for benzodiazepine, amphetamine, marijuana.  Records indicate at least at some point in the past was on Suboxone.  No response to Narcan here in the department.  Will allow to further metabolize and then reevaluate.  All of his work-up is otherwise unremarkable.  Will sign to oncoming ER physician to follow.  Final Clinical Impression(s) / ED Diagnoses Final diagnoses:  Acute encephalopathy    Rx / DC Orders ED Discharge Orders    None       Gilda Crease, MD 09/20/20 [phone]

## 2021-05-27 ENCOUNTER — Other Ambulatory Visit: Payer: Self-pay

## 2021-05-27 ENCOUNTER — Emergency Department (HOSPITAL_COMMUNITY): Payer: Medicaid Other

## 2021-05-27 ENCOUNTER — Emergency Department (HOSPITAL_COMMUNITY)
Admission: EM | Admit: 2021-05-27 | Discharge: 2021-05-27 | Disposition: A | Discharge status: Home / Self Care | Payer: Medicaid Other | Attending: Emergency Medicine | Admitting: Emergency Medicine

## 2021-05-27 DIAGNOSIS — W010XXA Fall on same level from slipping, tripping and stumbling without subsequent striking against object, initial encounter: Secondary | ICD-10-CM | POA: Insufficient documentation

## 2021-05-27 DIAGNOSIS — J45909 Unspecified asthma, uncomplicated: Secondary | ICD-10-CM | POA: Insufficient documentation

## 2021-05-27 DIAGNOSIS — F17210 Nicotine dependence, cigarettes, uncomplicated: Secondary | ICD-10-CM | POA: Diagnosis not present

## 2021-05-27 DIAGNOSIS — S29001A Unspecified injury of muscle and tendon of front wall of thorax, initial encounter: Secondary | ICD-10-CM | POA: Diagnosis present

## 2021-05-27 DIAGNOSIS — R109 Unspecified abdominal pain: Secondary | ICD-10-CM | POA: Diagnosis not present

## 2021-05-27 DIAGNOSIS — S20212A Contusion of left front wall of thorax, initial encounter: Secondary | ICD-10-CM | POA: Insufficient documentation

## 2021-05-27 MED ORDER — ACETAMINOPHEN 500 MG PO TABS
1000.0000 mg | ORAL_TABLET | Freq: Once | ORAL | Status: AC
  Administered 2021-05-27: 1000 mg via ORAL
  Filled 2021-05-27: qty 2

## 2021-05-27 NOTE — ED Triage Notes (Signed)
Pt c/o left sided chest pain and left sided flank pain for the past 2 days. Pt reports pain when he tries to take a deep breath. Pt reports he was lifting something heavy the other day and felt a pain in his left side.

## 2021-05-27 NOTE — ED Provider Notes (Signed)
[facility] EMERGENCY DEPARTMENT Provider Note  CSN: [id number] Arrival date & time: 05/27/21 2002  Chief Complaint(s) Chest Pain and Flank Pain  HPI Shawn Bolton is a 30 y.o. male   The history is provided by the patient.  Chest Pain Pain location:  L chest and L lateral chest Pain quality: aching and stabbing   Pain radiates to:  Does not radiate Pain severity:  Moderate Onset quality:  Gradual Duration:  2 days Timing:  Constant Progression:  Unchanged Chronicity:  New Context comment:  Tripped, fell backward while carrying a table that landed on his chest Relieved by:  Aspirin Worsened by:  Certain positions, deep breathing and movement Associated symptoms: no abdominal pain, no back pain, no cough, no dizziness, no fatigue, no fever, no headache, no lower extremity edema, no nausea, no numbness, no shortness of breath (just hurts with breathing) and no vomiting    Past Medical History Past Medical History:  Diagnosis Date   Asthma    There are no problems to display for this patient.  Home Medication(s) Prior to Admission medications   Medication Sig Start Date End Date Taking? Authorizing Provider  acetaminophen (TYLENOL) 325 MG tablet Take 325 mg by mouth every 8 (eight) hours as needed (for pain or headaches).    [provider]  amoxicillin (AMOXIL) 500 MG capsule Take 1 capsule (500 mg total) by mouth 3 (three) times daily. Patient not taking: Reported on 04/16/2017 03/12/15   Janne Napoleon, NP  Buprenorphine HCl-Naloxone HCl (SUBOXONE) 8-2 MG FILM Place 1 Film under the tongue daily.    [provider]  ciprofloxacin (CIPRO) 500 MG tablet Take 1 tablet (500 mg total) by mouth 2 (two) times daily. 06/12/17   Antony Madura, PA-C  cloNIDine (CATAPRES) 0.1 MG tablet Take 1 tablet (0.1 mg total) by mouth 2 (two) times daily. Patient not taking: Reported on 03/12/2015 09/25/13   Teressa Lower, NP  cyclobenzaprine (FLEXERIL)  10 MG tablet Take 1 tablet (10 mg total) by mouth 2 (two) times daily as needed for muscle spasms. 06/15/17   Rise Mu, PA-C  naproxen (NAPROSYN) 375 MG tablet Take 1 tablet (375 mg total) by mouth 2 (two) times daily. 06/15/17   Rise Mu, PA-C  ondansetron (ZOFRAN) 4 MG tablet Take 1 tablet (4 mg total) by mouth every 6 (six) hours. Patient not taking: Reported on 04/16/2017 09/25/13   Teressa Lower, NP                                                                                                                                    Past Surgical History No past surgical history on file. Family History No family history on file.  Social History Social History   Tobacco Use   Smoking status: Every Day    Packs/day: 1.00    Types: Cigarettes   Smokeless tobacco: Never  Substance Use  Topics   Alcohol use: No   Drug use: Yes    Types: Marijuana   Allergies Patient has no known allergies.  Review of Systems Review of Systems  Constitutional:  Negative for fatigue and fever.  Respiratory:  Negative for cough and shortness of breath (just hurts with breathing).   Cardiovascular:  Positive for chest pain.  Gastrointestinal:  Negative for abdominal pain, nausea and vomiting.  Musculoskeletal:  Negative for back pain.  Neurological:  Negative for dizziness, numbness and headaches.  All other systems are reviewed and are negative for acute change except as noted in the HPI  Physical Exam Vital Signs  I have reviewed the triage vital signs BP 131/84 (BP Location: Right Arm)   Pulse 73   Temp 97.8 F (36.6 C)   Resp 18   Ht 6' (1.829 m)   Wt 66.7 kg   SpO2 100%   BMI 19.94 kg/m   Physical Exam Vitals reviewed.  Constitutional:      General: He is not in acute distress.    Appearance: He is well-developed. He is not diaphoretic.  HENT:     Head: Normocephalic and atraumatic.     Right Ear: External ear normal.     Left Ear: External ear normal.      Nose: Nose normal.  Eyes:     General: No scleral icterus.       Right eye: No discharge.        Left eye: No discharge.     Conjunctiva/sclera: Conjunctivae normal.     Pupils: Pupils are equal, round, and reactive to light.  Cardiovascular:     Rate and Rhythm: Normal rate and regular rhythm.     Pulses:          Radial pulses are 2+ on the right side and 2+ on the left side.       Dorsalis pedis pulses are 2+ on the right side and 2+ on the left side.     Heart sounds: Normal heart sounds. No murmur heard.   No friction rub. No gallop.  Pulmonary:     Effort: Pulmonary effort is normal. No respiratory distress.     Breath sounds: Normal breath sounds. No stridor. No rales.  Chest:     Chest wall: Tenderness present.    Abdominal:     General: There is no distension.     Palpations: Abdomen is soft.     Tenderness: There is no abdominal tenderness.  Musculoskeletal:        General: No tenderness.     Cervical back: Normal range of motion and neck supple. No bony tenderness. No spinous process tenderness or muscular tenderness.     Thoracic back: No bony tenderness.     Lumbar back: No bony tenderness.     Comments: Clavicle stable. Chest stable to AP/Lat compression. Pelvis stable to Lat compression. No obvious extremity deformity. No chest or abdominal wall contusion.  Skin:    General: Skin is warm and dry.     Findings: No erythema or rash.  Neurological:     Mental Status: He is alert and oriented to person, place, and time.     GCS: GCS eye subscore is 4. GCS verbal subscore is 5. GCS motor subscore is 6.     Comments: Moving all extremities     ED Results and Treatments Labs (all labs ordered are listed, but only abnormal results are displayed) Labs Reviewed - No data to display  EKG  EKG Interpretation  Date/Time:  Monday May 27 2021  20:17:55 EDT Ventricular Rate:  79 PR Interval:  154 QRS Duration: 92 QT Interval:  336 QTC Calculation: 385 R Axis:   89 Text Interpretation: Normal sinus rhythm Normal ECG No acute changes Confirmed by Drema Pry [phone]) on 05/27/2021 11:19:37 PM       Radiology DG Ribs Unilateral W/Chest Left  Result Date: 05/27/2021 CLINICAL DATA:  Pain, shortness of breath, patient reports piece of furniture hit him with delayed onset of pain. Left-sided chest pain. EXAM: LEFT RIBS AND CHEST - 3+ VIEW COMPARISON:  Chest radiographs 07/05/2017. FINDINGS: Heart size within normal limits. No appreciable airspace consolidation. No evidence of pleural effusion or pneumothorax. No acute bony abnormality is identified. Specifically, no displaced rib fracture is identified. IMPRESSION: No displaced rib fracture is identified. No evidence of acute cardiopulmonary abnormality. Electronically Signed   By: Jackey Loge D.O.   On: 05/27/2021 20:56    Pertinent labs & imaging results that were available during my care of the patient were reviewed by me and considered in my medical decision making (see MDM for details).  Medications Ordered in ED Medications  acetaminophen (TYLENOL) tablet 1,000 mg (has no administration in time range)                                                                                                                                     Procedures Procedures  (including critical care time)  Medical Decision Making / ED Course I have reviewed the nursing notes for this encounter and the patient's prior records (if available in EHR or on provided paperwork).  Kortez Murtagh was evaluated in Emergency Department on 05/27/2021 for the symptoms described in the history of present illness. He was evaluated in the context of the global COVID-19 pandemic, which necessitated consideration that the patient might be at risk for infection with the SARS-CoV-2 virus that causes COVID-19.  Institutional protocols and algorithms that pertain to the evaluation of patients at risk for COVID-19 are in a state of rapid change based on information released by regulatory bodies including the CDC and federal and state organizations. These policies and algorithms were followed during the patient's care in the ED.     Chest wall pain s/p minor trauma. Completely reproducible with palpation. EKG non-ischemic. Not concerning for ACS, PE, Dissection, Esophageal perforation. Plain ordered to assess for fracture. No other signs of trauma requiring work up.  Pertinent labs & imaging results that were available during my care of the patient were reviewed by me and considered in my medical decision making:  Plain film w/o rib fracture, PTx, or PNA. Recommended supportive management, including pain control and deep breathing.  Final Clinical Impression(s) / ED Diagnoses Final diagnoses:  Contusion of left chest wall, initial encounter   The patient appears reasonably screened and/or stabilized for discharge and I doubt any other medical  condition or other [facility] requiring further screening, evaluation, or treatment in the ED at this time prior to discharge. Safe for discharge with strict return precautions.  Disposition: Discharge  Condition: Good  I have discussed the results, Dx and Tx plan with the patient/family who expressed understanding and agree(s) with the plan. Discharge instructions discussed at length. The patient/family was given strict return precautions who verbalized understanding of the instructions. No further questions at time of discharge.    ED Discharge Orders     None      Follow Up: Primary care provider  Call  as needed, if symptoms do not improve or  worsen     This chart was dictated using voice recognition software.  Despite best efforts to proofread,  errors can occur which can change the documentation meaning.    Nira Conn,  MD 05/27/21 2325

## 2021-05-27 NOTE — ED Notes (Signed)
Discharge instructions discussed with pt. Pt provided with incentive spirometer. Pt was able to demonstrate correct use. Pt verbalized understanding with no other questions at this time

## 2021-05-27 NOTE — Discharge Instructions (Addendum)
For pain control you may take at 1000 mg of Tylenol every 8 hours or 600 mg on Ibuprofen every 6 hours as needed for pain .   Incentive spirometer: Use frequently to prevent pneumonia.  Recommended usage: 10 deep inhalations through spirometer every 2-3 hours for 7-10 days.

## 2021-05-27 NOTE — ED Notes (Signed)
Pt observed leaving the lobby

## 2021-05-27 NOTE — ED Provider Notes (Signed)
Emergency Medicine Provider Triage Evaluation Note  Shawn Bolton , a 30 y.o. male  was evaluated in triage.  Pt complains of left-sided chest pain.  He states that he was lifting some furniture and it hit him in the chest.  He denies any immediate onset of shortness of breath.  He states that he felt okay and then the next morning when he woke up he had pain in his left-sided chest.  He states that it is progressed and he now feels short of breath.  He denies any fevers.  He denies any recent injection drug use, rashes.  He denies any known cardiac history..  Review of Systems  Positive: Left sided chest pain, shortness of breath Negative: Syncope, fevers  Physical Exam  BP 131/77 (BP Location: Right Arm)   Pulse 86   Temp 97.8 F (36.6 C)   Resp 17   Ht 6' (1.829 m)   Wt 66.7 kg   SpO2 99%   BMI 19.94 kg/m  Gen:   Awake, no distress   Resp:  Normal effort  MSK:   Moves extremities without difficulty  Other:  No respiratory distress.  No contusion visualized on chest.  No paradoxical chest movement noted.  There is diffuse tenderness over the left-sided chest.  Medical Decision Making  Medically screening exam initiated at 8:31 PM.  Appropriate orders placed.  Shawn Bolton was informed that the remainder of the evaluation will be completed by another provider, this initial triage assessment does not replace that evaluation, and the importance of remaining in the ED until their evaluation is complete.  EKG was obtained prior to the evaluation.  Patient has no known cardiac history.  Denies any recent injection drug use, is having some shortness of breath.  He had a piece of furniture lately hit him on the left side of the chest, however he states that he did not have any pain until the next morning.  He is diffusely tender to palpation on the left-sided chest. He is awake and alert and generally well-appearing without contusion on the chest. Will obtain chest and rib  x-rays.  Note: Portions of this report may have been transcribed using voice recognition software. Every effort was made to ensure accuracy; however, inadvertent computerized transcription errors may be present    [name] 05/27/21 2034    Linwood Dibbles, MD 05/28/21 2126

## 2021-06-08 ENCOUNTER — Ambulatory Visit (HOSPITAL_COMMUNITY)
Admission: EM | Admit: 2021-06-08 | Discharge: 2021-06-08 | Disposition: A | Discharge status: Home / Self Care | Payer: Medicaid Other

## 2021-06-08 ENCOUNTER — Encounter (HOSPITAL_COMMUNITY): Payer: Self-pay | Admitting: Emergency Medicine

## 2021-06-08 ENCOUNTER — Other Ambulatory Visit: Payer: Self-pay

## 2021-06-08 DIAGNOSIS — S80862A Insect bite (nonvenomous), left lower leg, initial encounter: Secondary | ICD-10-CM

## 2021-06-08 DIAGNOSIS — L03116 Cellulitis of left lower limb: Secondary | ICD-10-CM

## 2021-06-08 DIAGNOSIS — W57XXXA Bitten or stung by nonvenomous insect and other nonvenomous arthropods, initial encounter: Secondary | ICD-10-CM | POA: Diagnosis not present

## 2021-06-08 HISTORY — DX: Essential (primary) hypertension: I10

## 2021-06-08 HISTORY — DX: Attention-deficit hyperactivity disorder, unspecified type: F90.9

## 2021-06-08 MED ORDER — HYDROXYZINE HCL 25 MG PO TABS: 12.5000 mg | ORAL_TABLET | Freq: Three times a day (TID) | ORAL | 0 refills | Status: AC | PRN

## 2021-06-08 MED ORDER — NAPROXEN 500 MG PO TABS: 500.0000 mg | ORAL_TABLET | Freq: Two times a day (BID) | ORAL | 0 refills | Status: AC

## 2021-06-08 MED ORDER — CEPHALEXIN 500 MG PO CAPS: 500.0000 mg | ORAL_CAPSULE | Freq: Three times a day (TID) | ORAL | 0 refills | Status: DC

## 2021-06-08 NOTE — ED Triage Notes (Addendum)
Patient c/o possible insect bite that happened yesterday.   Patient states " [place] in [place] when something bit me , I'm not really sure what it was".   Patient has rash present to LFT ankle.   Patient denies itching.   Patient endorses pain and swelling.   Patient took Benadryl and Hydrocortisone cream with no relief of symptoms.   Patient is unsure on tetanus booster states , " maybe 3-4 years ago".   Patient has last Tdap vaccine 06/12/2017 per immunization tab in Epic.

## 2021-06-08 NOTE — ED Provider Notes (Signed)
[facility]   MRN: [id number] DOB: [birth year]  Subjective:   Shawn Bolton is a 30 y.o. male presenting for 1 day history of acute onset worsening moderate left-sided ankle pain with swelling and redness.  Patient states that he was outdoors painting the driveway.  States that he suffered multiple insect bites.  Initially the area was not bothering him as much but has since progressed significantly.  Has been using Benadryl and hydrocortisone cream without relief.  Tetanus injection was done in 2018.  Denies fever, nausea, vomiting, facial or oral swelling, chest tightness, shortness of breath.  Has not started any new medications, no other new exposures.  The rash is not found anywhere else on his body.  No current facility-administered medications for this encounter.  Current Outpatient Medications:    acetaminophen (TYLENOL) 325 MG tablet, Take 325 mg by mouth every 8 (eight) hours as needed (for pain or headaches)., Disp: , Rfl:    amoxicillin (AMOXIL) 500 MG capsule, Take 1 capsule (500 mg total) by mouth 3 (three) times daily. (Patient not taking: Reported on 04/16/2017), Disp: 21 capsule, Rfl: 0   Buprenorphine HCl-Naloxone HCl (SUBOXONE) 8-2 MG FILM, Place 1 Film under the tongue daily., Disp: , Rfl:    ciprofloxacin (CIPRO) 500 MG tablet, Take 1 tablet (500 mg total) by mouth 2 (two) times daily., Disp: 20 tablet, Rfl: 0   cloNIDine (CATAPRES) 0.1 MG tablet, Take 1 tablet (0.1 mg total) by mouth 2 (two) times daily. (Patient not taking: Reported on 03/12/2015), Disp: 10 tablet, Rfl: 0   cyclobenzaprine (FLEXERIL) 10 MG tablet, Take 1 tablet (10 mg total) by mouth 2 (two) times daily as needed for muscle spasms., Disp: 10 tablet, Rfl: 0   naproxen (NAPROSYN) 375 MG tablet, Take 1 tablet (375 mg total) by mouth 2 (two) times daily., Disp: 20 tablet, Rfl: 0   ondansetron (ZOFRAN) 4 MG tablet, Take 1 tablet (4 mg total) by mouth every 6 (six) hours. (Patient not taking:  Reported on 04/16/2017), Disp: 12 tablet, Rfl: 0   No Known Allergies  Past Medical History:  Diagnosis Date   Asthma      No past surgical history on file.  No family history on file.  Social History   Tobacco Use   Smoking status: Every Day    Packs/day: 1.00    Types: Cigarettes   Smokeless tobacco: Never  Substance Use Topics   Alcohol use: No   Drug use: Yes    Types: Marijuana    ROS   Objective:   Vitals: BP 138/71 (BP Location: Left Arm)   Pulse (!) 117   Temp 98.8 F (37.1 C) (Oral)   Resp 16   SpO2 98%   Physical Exam Constitutional:      General: He is not in acute distress.    Appearance: Normal appearance. He is well-developed and normal weight. He is not ill-appearing, toxic-appearing or diaphoretic.  HENT:     Head: Normocephalic and atraumatic.     Right Ear: External ear normal.     Left Ear: External ear normal.     Nose: Nose normal.     Mouth/Throat:     Pharynx: Oropharynx is clear.  Eyes:     General: No scleral icterus.       Right eye: No discharge.        Left eye: No discharge.     Extraocular Movements: Extraocular movements intact.     Pupils: Pupils are equal, round,  and reactive to light.  Cardiovascular:     Rate and Rhythm: Normal rate.  Pulmonary:     Effort: Pulmonary effort is normal.  Musculoskeletal:     Cervical back: Normal range of motion.       Feet:  Neurological:     Mental Status: He is alert and oriented to person, place, and time.  Psychiatric:        Mood and Affect: Mood normal.        Behavior: Behavior normal.        Thought Content: Thought content normal.        Judgment: Judgment normal.      Assessment and Plan :   PDMP not reviewed this encounter.  1. Cellulitis of left lower extremity   2. Insect bite of left lower leg, initial encounter     Will cover for cellulitis secondary to multiple insect bites.  Recommended starting Keflex, use naproxen for pain and inflammation.   Hydroxyzine for antihistamine properties. Counseled patient on potential for adverse effects with medications prescribed/recommended today, ER and return-to-clinic precautions discussed, patient verbalized understanding.    Wallis Bamberg, [place] 06/08/21 1713

## 2022-01-12 ENCOUNTER — Emergency Department
Admission: EM | Admit: 2022-01-12 | Discharge: 2022-01-12 | Disposition: A | Discharge status: Home / Self Care | Payer: Medicaid Other | Attending: Emergency Medicine | Admitting: Emergency Medicine

## 2022-01-12 ENCOUNTER — Other Ambulatory Visit: Payer: Self-pay

## 2022-01-12 ENCOUNTER — Encounter: Payer: Self-pay | Admitting: Emergency Medicine

## 2022-01-12 DIAGNOSIS — Y92410 Unspecified street and highway as the place of occurrence of the external cause: Secondary | ICD-10-CM | POA: Diagnosis not present

## 2022-01-12 DIAGNOSIS — J45909 Unspecified asthma, uncomplicated: Secondary | ICD-10-CM | POA: Diagnosis not present

## 2022-01-12 DIAGNOSIS — S199XXA Unspecified injury of neck, initial encounter: Secondary | ICD-10-CM | POA: Diagnosis present

## 2022-01-12 DIAGNOSIS — S161XXA Strain of muscle, fascia and tendon at neck level, initial encounter: Secondary | ICD-10-CM | POA: Diagnosis not present

## 2022-01-12 DIAGNOSIS — I10 Essential (primary) hypertension: Secondary | ICD-10-CM | POA: Diagnosis not present

## 2022-01-12 MED ORDER — CYCLOBENZAPRINE HCL 10 MG PO TABS: 10.0000 mg | ORAL_TABLET | Freq: Three times a day (TID) | ORAL | 0 refills | Status: AC | PRN
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
## 2022-01-12 NOTE — Discharge Instructions (Addendum)
-  Take the cyclobenzaprine as needed for muscle relaxation. ?-You may additionally take Tylenol/ibuprofen as needed for pain. ?-Review the educational material provided. ?-Return to the emergency department anytime if you begin to experience any new or worsening symptoms ?
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
## 2023-03-06 ENCOUNTER — Emergency Department: Payer: Medicaid Other

## 2023-03-06 ENCOUNTER — Emergency Department
Admission: EM | Admit: 2023-03-06 | Discharge: 2023-03-06 | Disposition: A | Discharge status: Home / Self Care | Payer: Medicaid Other | Attending: Emergency Medicine | Admitting: Emergency Medicine

## 2023-03-06 ENCOUNTER — Other Ambulatory Visit: Payer: Self-pay

## 2023-03-06 ENCOUNTER — Encounter: Payer: Self-pay | Admitting: Emergency Medicine

## 2023-03-06 DIAGNOSIS — Z79899 Other long term (current) drug therapy: Secondary | ICD-10-CM | POA: Insufficient documentation

## 2023-03-06 DIAGNOSIS — R059 Cough, unspecified: Secondary | ICD-10-CM | POA: Diagnosis present

## 2023-03-06 DIAGNOSIS — J45909 Unspecified asthma, uncomplicated: Secondary | ICD-10-CM | POA: Diagnosis not present

## 2023-03-06 DIAGNOSIS — I10 Essential (primary) hypertension: Secondary | ICD-10-CM | POA: Diagnosis not present

## 2023-03-06 DIAGNOSIS — J189 Pneumonia, unspecified organism: Secondary | ICD-10-CM

## 2023-03-06 DIAGNOSIS — Z1152 Encounter for screening for COVID-19: Secondary | ICD-10-CM | POA: Insufficient documentation

## 2023-03-06 LAB — SARS CORONAVIRUS 2 BY RT PCR: SARS Coronavirus 2 by RT PCR: NEGATIVE

## 2023-03-06 MED ORDER — AMOXICILLIN 500 MG PO CAPS
500.0000 mg | ORAL_CAPSULE | Freq: Once | ORAL | Status: AC
  Administered 2023-03-06: 500 mg via ORAL
  Filled 2023-03-06: qty 1

## 2023-03-06 MED ORDER — PREDNISONE 20 MG PO TABS
60.0000 mg | ORAL_TABLET | Freq: Once | ORAL | Status: AC
  Administered 2023-03-06: 60 mg via ORAL
  Filled 2023-03-06: qty 3

## 2023-03-06 MED ORDER — HYDROCOD POLI-CHLORPHE POLI ER 10-8 MG/5ML PO SUER: 5.0000 mL | Freq: Two times a day (BID) | ORAL | 0 refills | Status: AC | PRN

## 2023-03-06 MED ORDER — AMOXICILLIN 500 MG PO CAPS: 500.0000 mg | ORAL_CAPSULE | Freq: Three times a day (TID) | ORAL | 0 refills | Status: DC

## 2023-03-06 MED ORDER — AZITHROMYCIN 250 MG PO TABS: 250.0000 mg | ORAL_TABLET | Freq: Every day | ORAL | 0 refills | Status: DC

## 2023-03-06 MED ORDER — ALBUTEROL SULFATE HFA 108 (90 BASE) MCG/ACT IN AERS
2.0000 | INHALATION_SPRAY | Freq: Once | RESPIRATORY_TRACT | Status: AC
  Administered 2023-03-06: 2 via RESPIRATORY_TRACT
  Filled 2023-03-06: qty 6.7

## 2023-03-06 MED ORDER — PREDNISONE 20 MG PO TABS: ORAL_TABLET | ORAL | 0 refills | Status: AC

## 2023-03-06 MED ORDER — AZITHROMYCIN 500 MG PO TABS
500.0000 mg | ORAL_TABLET | Freq: Once | ORAL | Status: AC
  Administered 2023-03-06: 500 mg via ORAL
  Filled 2023-03-06: qty 1

## 2023-03-06 NOTE — Discharge Instructions (Addendum)
Take antibiotics as prescribed: Amoxicillin 500mg  3 times daily x 7 days Azithromycin 250mg  daily x 4 days 2.  Take Prednisone daily until finished. 3.  You may take cough medicine as needed. 4.  You may use Albuterol inhaler 2 puffs every 4 hours as needed for difficulty breathing. 5.  Return to the ER for worsening symptoms, persistent vomiting, difficulty breathing or other concerns.

## 2023-03-06 NOTE — ED Provider Notes (Signed)
[facility] Provider Note    Event Date/Time   First MD Initiated Contact with Patient 03/06/23 0251     (approximate)   History   Cough   HPI  Shawn Bolton is a 32 y.o. male who presents to the ED from home with a 1 week history of congestion, cough productive of green sputum and subjective fevers.  Patient with a history of asthma as a child.  Denies chest pain, shortness of breath, abdominal pain, nausea, vomiting or dizziness.     Past Medical History   Past Medical History:  Diagnosis Date  . ADHD   . Asthma   . Hypertension      Active Problem List  There are no problems to display for this patient.    Past Surgical History  History reviewed. No pertinent surgical history.   Home Medications   Prior to Admission medications   Medication Sig Start Date End Date Taking? Authorizing Provider  amoxicillin (AMOXIL) 500 MG capsule Take 1 capsule (500 mg total) by mouth 3 (three) times daily. 03/06/23  Yes Irean Hong, MD  azithromycin (ZITHROMAX) 250 MG tablet Take 1 tablet (250 mg total) by mouth daily. 03/06/23  Yes Irean Hong, MD  chlorpheniramine-HYDROcodone (TUSSIONEX) 10-8 MG/5ML Take 5 mLs by mouth every 12 (twelve) hours as needed for cough. 03/06/23  Yes Irean Hong, MD  predniSONE (DELTASONE) 20 MG tablet 3 tablets daily x 4 days 03/06/23  Yes Irean Hong, MD  acetaminophen (TYLENOL) 325 MG tablet Take 325 mg by mouth every 8 (eight) hours as needed (for pain or headaches).    [provider]  amphetamine-dextroamphetamine (ADDERALL) 30 MG tablet Take 1 tablet by mouth 2 (two) times daily. 06/04/21   [provider]  Buprenorphine HCl-Naloxone HCl 8-2 MG FILM Place 1 Film under the tongue daily.    [provider]  cephALEXin (KEFLEX) 500 MG capsule Take 1 capsule (500 mg total) by mouth 3 (three) times daily. 06/08/21   Wallis Bamberg, PA-C  ciprofloxacin (CIPRO) 500 MG tablet Take 1 tablet (500  mg total) by mouth 2 (two) times daily. 06/12/17   Antony Madura, PA-C  cloNIDine (CATAPRES) 0.1 MG tablet Take 1 tablet (0.1 mg total) by mouth 2 (two) times daily. Patient not taking: No sig reported 09/25/13   Teressa Lower, NP  hydrOXYzine (ATARAX/VISTARIL) 25 MG tablet Take 0.5 tablets (12.5 mg total) by mouth every 8 (eight) hours as needed for itching. 06/08/21   Wallis Bamberg, PA-C  LORazepam (ATIVAN) 1 MG tablet Take 1 mg by mouth daily as needed. 06/03/21   [provider]  naproxen (NAPROSYN) 500 MG tablet Take 1 tablet (500 mg total) by mouth 2 (two) times daily with a meal. 06/08/21   Wallis Bamberg, PA-C  ondansetron (ZOFRAN) 4 MG tablet Take 1 tablet (4 mg total) by mouth every 6 (six) hours. Patient not taking: No sig reported 09/25/13   Teressa Lower, NP     Allergies  Patient has no known allergies.   Family History  History reviewed. No pertinent family history.   Physical Exam  Triage Vital Signs: ED Triage Vitals  Enc Vitals Group     BP 03/06/23 0233 (!) 148/90     Pulse Rate 03/06/23 0233 79     Resp 03/06/23 0233 19     Temp 03/06/23 0233 98 F (36.7 C)     Temp Source 03/06/23 0233 Oral     SpO2 03/06/23  0233 100 %     Weight 03/06/23 0234 130 lb (59 kg)     Height 03/06/23 0234 5\' 11"  (1.803 m)     Head Circumference --      Peak Flow --      Pain Score 03/06/23 0234 0     Pain Loc --      Pain Edu? --      Excl. in GC? --     Updated Vital Signs: BP (!) 148/90 (BP Location: Left Arm)   Pulse 79   Temp 98 F (36.7 C) (Oral)   Resp 19   Ht 5\' 11"  (1.803 m)   Wt 59 kg   SpO2 100%   BMI 18.13 kg/m    General: Awake, no distress.  CV:  RRR.  Good peripheral perfusion.  Resp:  Normal effort.  CTAB. Abd:  No distention.  Other:  Bilateral calves are supple and nontender.   ED Results / Procedures / Treatments  Labs (all labs ordered are listed, but only abnormal results are displayed) Labs Reviewed  SARS CORONAVIRUS 2 BY RT PCR      EKG  None   RADIOLOGY I have independently visualized and interpreted patient's chest x-ray as well as noted the radiology interpretation:  X-ray: Lingular pneumonia  Official radiology report(s): DG Chest 2 View  Result Date: 03/06/2023 CLINICAL DATA:  Cough EXAM: CHEST - 2 VIEW COMPARISON:  05/27/2021 FINDINGS: Cardiac shadow is within normal limits. Lungs are well aerated bilaterally. Left lingular infiltrate is noted in the base. No sizable effusion is seen. No bony abnormality is noted IMPRESSION: Lingular infiltrate Electronically Signed   By: Alcide Clever M.D.   On: 03/06/2023 02:57     PROCEDURES:  Critical Care performed: No  Procedures   MEDICATIONS ORDERED IN ED: Medications  amoxicillin (AMOXIL) capsule 500 mg (has no administration in time range)  azithromycin (ZITHROMAX) tablet 500 mg (has no administration in time range)  albuterol (VENTOLIN HFA) 108 (90 Base) MCG/ACT inhaler 2 puff (has no administration in time range)  predniSONE (DELTASONE) tablet 60 mg (has no administration in time range)     IMPRESSION / MDM / ASSESSMENT AND PLAN / ED COURSE  I reviewed the triage vital signs and the nursing notes.                             32 year old male presenting with cold-like symptoms.  Patient's presentation is most consistent with acute, uncomplicated illness.   1610 Updated patient on negative COVID swab, lingular pneumonia seen on chest x-ray.  Will start double coverage with amoxicillin and azithromycin, prednisone burst, Tussionex as needed and albuterol inhaler as needed.  Patient will follow-up closely with his PCP.  Strict return precautions given.  Patient verbalizes understanding agrees with plan of care.  FINAL CLINICAL IMPRESSION(S) / ED DIAGNOSES   Final diagnoses:  Community acquired pneumonia, unspecified laterality     Rx / DC Orders   ED Discharge Orders          Ordered    amoxicillin (AMOXIL) 500 MG capsule  3 times  daily        03/06/23 0322    azithromycin (ZITHROMAX) 250 MG tablet  Daily        03/06/23 0322    predniSONE (DELTASONE) 20 MG tablet        03/06/23 0322    chlorpheniramine-HYDROcodone (TUSSIONEX) 10-8 MG/5ML  Every 12 hours PRN  03/06/23 0322             Note:  This document was prepared using Dragon voice recognition software and may include unintentional dictation errors.   Irean Hong, MD 03/06/23 0600

## 2023-03-06 NOTE — ED Triage Notes (Signed)
Patient ambulatory to triage with steady gait, without difficulty or distress noted; pt reports for last wk having sinus congestion, prod cough green sputum & fever

## 2024-01-06 ENCOUNTER — Other Ambulatory Visit: Payer: Self-pay

## 2024-01-06 ENCOUNTER — Emergency Department
Admission: EM | Admit: 2024-01-06 | Discharge: 2024-01-06 | Disposition: A | Discharge status: Home / Self Care | Payer: MEDICAID | Attending: Emergency Medicine | Admitting: Emergency Medicine

## 2024-01-06 DIAGNOSIS — Z23 Encounter for immunization: Secondary | ICD-10-CM | POA: Insufficient documentation

## 2024-01-06 DIAGNOSIS — S81011A Laceration without foreign body, right knee, initial encounter: Secondary | ICD-10-CM | POA: Diagnosis not present

## 2024-01-06 DIAGNOSIS — W312XXA Contact with powered woodworking and forming machines, initial encounter: Secondary | ICD-10-CM | POA: Diagnosis not present

## 2024-01-06 DIAGNOSIS — S81811A Laceration without foreign body, right lower leg, initial encounter: Secondary | ICD-10-CM

## 2024-01-06 DIAGNOSIS — S8991XA Unspecified injury of right lower leg, initial encounter: Secondary | ICD-10-CM | POA: Diagnosis present

## 2024-01-06 MED ORDER — TETANUS-DIPHTH-ACELL PERTUSSIS 5-2.5-18.5 LF-MCG/0.5 IM SUSY
0.5000 mL | PREFILLED_SYRINGE | Freq: Once | INTRAMUSCULAR | Status: AC
  Administered 2024-01-06: 0.5 mL via INTRAMUSCULAR
  Filled 2024-01-06: qty 0.5

## 2024-01-06 MED ORDER — DOXYCYCLINE HYCLATE 100 MG PO TABS: 100.0000 mg | ORAL_TABLET | Freq: Two times a day (BID) | ORAL | 0 refills | Status: AC

## 2024-01-06 MED ORDER — DOXYCYCLINE HYCLATE 100 MG PO TABS
100.0000 mg | ORAL_TABLET | Freq: Once | ORAL | Status: AC
  Administered 2024-01-06: 100 mg via ORAL
  Filled 2024-01-06: qty 1

## 2024-01-06 NOTE — ED Triage Notes (Signed)
 Pt reports he cut his right leg above the knee on a circular saw 2 days ago. Bleeding controlled. Laceration does not appear to be open at this time

## 2024-01-06 NOTE — Discharge Instructions (Addendum)
 You have been diagnosed with a right leg laceration.  Please do not submerge the right leg in water.  After 5 days the glue will peel by itself.  Please take doxycycline 1 tablet by mouth every 12 hours daily for 10 days after main meals.  Come back to ED if you have new symptoms or symptoms worsen.  Please check for signs of infection like redness, swelling, tenderness, pus.

## 2024-01-06 NOTE — ED Provider Notes (Signed)
 [facility] Provider Note    Event Date/Time   First MD Initiated Contact with Patient 01/06/24 2015     (approximate)   History   Laceration    HPI  Shawn Bolton is a 33 y.o. male  with no significant past medical history who presents to the ED complaining of laceration in the right leg.   According to the patient, he got his right neck above his knee with a circular saw 2 days ago.  Patient states he put  adhesives in the wound.  Patient denies fever chills pain.  Patient cannot remember last Tdap.      Physical Exam   Triage Vital Signs: ED Triage Vitals  Encounter Vitals Group     BP 01/06/24 1910 (!) 140/86     Systolic BP Percentile --      Diastolic BP Percentile --      Pulse Rate 01/06/24 1910 (!) 101     Resp 01/06/24 1910 19     Temp 01/06/24 1910 (!) 97.5 F (36.4 C)     Temp Source 01/06/24 1910 Oral     SpO2 01/06/24 1910 100 %     Weight 01/06/24 1909 136 lb (61.7 kg)     Height 01/06/24 1909 6' (1.829 m)     Head Circumference --      Peak Flow --      Pain Score 01/06/24 1908 7     Pain Loc --      Pain Education --      Exclude from Growth Chart --     Most recent vital signs: Vitals:   01/06/24 1910  BP: (!) 140/86  Pulse: (!) 101  Resp: 19  Temp: (!) 97.5 F (36.4 C)  SpO2: 100%     Constitutional: Alert, NAD. Able to speak in complete sentences without cough or dyspnea  Eyes: Conjunctivae are normal.  Head: Atraumatic. Nose: No congestion/rhinnorhea. Mouth/Throat: Mucous membranes are moist.   Neck: Painless ROM. Supple. No JVD, nodes, thyromegaly  Cardiovascular:   Good peripheral circulation.RRR no murmurs, gallops, rubs  Respiratory: Normal respiratory effort.  No retractions. Clear to auscultation bilaterally without wheezing or crackles  Gastrointestinal: Soft and nontender.  Musculoskeletal:  no deformity Right lower extremity: Laceration above knee, anterior side, about 7 cm.  Skin is  healing, no active bleeding.  No presence of erythema, tenderness, pus, hardness.  Neurologic:  MAE spontaneously. No gross focal neurologic deficits are appreciated.  Skin:  Skin is warm, dry and intact. No rash noted. Psychiatric: Mood and affect are normal. Speech and behavior are normal.    ED Results / Procedures / Treatments   Labs (all labs ordered are listed, but only abnormal results are displayed) Labs Reviewed - No data to display   EKG     RADIOLOGY     PROCEDURES:  Critical Care performed:   .Laceration Repair  Date/Time: 01/06/2024 8:47 PM  Performed by: Gladys Damme, PA-C Authorized by: Gladys Damme, PA-C   Consent:    Consent obtained:  Verbal   Consent given by:  Patient   Risks, benefits, and alternatives were discussed: yes     Risks discussed:  Infection and pain Universal protocol:    Procedure explained and questions answered to patient or proxy's satisfaction: yes     Patient identity confirmed:  Verbally with patient Anesthesia:    Anesthesia method:  None Laceration details:    Location:  Leg   Leg location:  R upper leg   Length (cm):  7   Depth (mm):  1 Exploration:    Limited defect created (wound extended): no     Imaging outcome: foreign body not noted   Treatment:    Area cleansed with:  Povidone-iodine   Amount of cleaning:  Standard   Irrigation solution:  Sterile saline   Irrigation volume:  500 ml   Irrigation method:  Pressure wash   Visualized foreign bodies/material removed: no     Debridement:  None   Scar revision: no   Skin repair:    Repair method:  Tissue adhesive Approximation:    Approximation:  Close Repair type:    Repair type:  Simple Post-procedure details:    Dressing:  Open (no dressing)   Procedure completion:  Tolerated well, no immediate complications    MEDICATIONS ORDERED IN ED: Medications  Tdap (BOOSTRIX) injection 0.5 mL (has no administration in time range)  doxycycline  (VIBRA-TABS) tablet 100 mg (has no administration in time range)      IMPRESSION / MDM / ASSESSMENT AND PLAN / ED COURSE  I reviewed the triage vital signs and the nursing notes.  Differential diagnosis includes, but is not limited to, laceration, avulsion, foreign body, cellulitis  Patient's presentation is most consistent with acute, uncomplicated illness.   Patient's diagnosis is consistent with laceration right leg. I did review the patient's allergies and medications.The patient is in stable and satisfactory condition for discharge home  Patient will be discharged home with prescriptions for doxycycline. Patient is to follow up with PCP as needed or otherwise directed. Patient is given ED precautions to return to the ED for any worsening or new symptoms. Discussed plan of care with patient, answered all of patient's questions, Patient agreeable to plan of care. Advised patient to take medications according to the instructions on the label. Discussed possible side effects of new medications. Patient verbalized understanding.    FINAL CLINICAL IMPRESSION(S) / ED DIAGNOSES   Final diagnoses:  Laceration of right lower extremity, initial encounter     Rx / DC Orders   ED Discharge Orders          Ordered    doxycycline (VIBRA-TABS) 100 MG tablet  2 times daily        01/06/24 2052             Note:  This document was prepared using Dragon voice recognition software and may include unintentional dictation errors.   Awilda Lennox, PA-C 01/06/24 2053    Shane Darling, MD 01/06/24 2100
